# Patient Record
Sex: Female | Born: 1995 | Race: Black or African American | Hispanic: No | Smoking: Current every day smoker
Health system: Southern US, Community
[De-identification: ages and names within clinical notes are randomized; demographics above are authoritative.]

## PROBLEM LIST (undated history)

## (undated) DIAGNOSIS — Z789 Other specified health status: Secondary | ICD-10-CM

## (undated) DIAGNOSIS — F4310 Post-traumatic stress disorder, unspecified: Secondary | ICD-10-CM

## (undated) DIAGNOSIS — R443 Hallucinations, unspecified: Secondary | ICD-10-CM

## (undated) DIAGNOSIS — F329 Major depressive disorder, single episode, unspecified: Secondary | ICD-10-CM

## (undated) DIAGNOSIS — F32A Depression, unspecified: Secondary | ICD-10-CM

## (undated) HISTORY — PX: NO PAST SURGERIES: SHX2092

---

## 2016-07-29 ENCOUNTER — Encounter (HOSPITAL_COMMUNITY): Payer: Self-pay

## 2016-07-29 ENCOUNTER — Inpatient Hospital Stay (HOSPITAL_COMMUNITY)
Admission: AD | Admit: 2016-07-29 | Discharge: 2016-07-29 | Disposition: A | Discharge status: Home / Self Care | Payer: Self-pay | Source: Ambulatory Visit | Attending: Obstetrics and Gynecology | Admitting: Obstetrics and Gynecology

## 2016-07-29 DIAGNOSIS — M545 Low back pain, unspecified: Secondary | ICD-10-CM

## 2016-07-29 DIAGNOSIS — Z809 Family history of malignant neoplasm, unspecified: Secondary | ICD-10-CM | POA: Insufficient documentation

## 2016-07-29 DIAGNOSIS — N760 Acute vaginitis: Secondary | ICD-10-CM

## 2016-07-29 DIAGNOSIS — F17210 Nicotine dependence, cigarettes, uncomplicated: Secondary | ICD-10-CM | POA: Insufficient documentation

## 2016-07-29 DIAGNOSIS — R110 Nausea: Secondary | ICD-10-CM | POA: Insufficient documentation

## 2016-07-29 DIAGNOSIS — B9689 Other specified bacterial agents as the cause of diseases classified elsewhere: Secondary | ICD-10-CM

## 2016-07-29 DIAGNOSIS — N926 Irregular menstruation, unspecified: Secondary | ICD-10-CM

## 2016-07-29 DIAGNOSIS — M549 Dorsalgia, unspecified: Secondary | ICD-10-CM | POA: Insufficient documentation

## 2016-07-29 HISTORY — DX: Other specified health status: Z78.9

## 2016-07-29 LAB — URINALYSIS, ROUTINE W REFLEX MICROSCOPIC
Glucose, UA: NEGATIVE mg/dL
HGB URINE DIPSTICK: NEGATIVE
Ketones, ur: NEGATIVE mg/dL
Leukocytes, UA: NEGATIVE
NITRITE: NEGATIVE
PROTEIN: 100 mg/dL — AB
SPECIFIC GRAVITY, URINE: 1.027 (ref 1.005–1.030)
pH: 5 (ref 5.0–8.0)

## 2016-07-29 LAB — POCT PREGNANCY, URINE: Preg Test, Ur: NEGATIVE

## 2016-07-29 LAB — WET PREP, GENITAL
Sperm: NONE SEEN
Trich, Wet Prep: NONE SEEN
Yeast Wet Prep HPF POC: NONE SEEN

## 2016-07-29 MED ORDER — METRONIDAZOLE 500 MG PO TABS: 500.0000 mg | ORAL_TABLET | Freq: Two times a day (BID) | ORAL | 0 refills | Status: DC

## 2016-07-29 NOTE — MAU Provider Note (Signed)
History   nulparous in with c/o no period in two months. States periods are usually irreg.Marland Kitchen. Desires screen for STD's. Does not desire contraception.  CSN: [id number]  Arrival date & time 07/29/16  1018   None     Chief Complaint  Patient presents with  . Nausea  . Fatigue  . Back Pain    HPI  Past Medical History:  Diagnosis Date  . Medical history non-contributory     Past Surgical History:  Procedure Laterality Date  . NO PAST SURGERIES      Family History  Problem Relation Age of Onset  . Cancer Paternal Grandmother     Social History  Substance Use Topics  . Smoking status: Current Every Day Smoker    Packs/day: 0.25  . Smokeless tobacco: Never Used  . Alcohol use No    OB History    No data available      Review of Systems  Constitutional: Negative.   HENT: Negative.   Eyes: Negative.   Respiratory: Negative.   Cardiovascular: Negative.   Gastrointestinal: Positive for nausea.  Endocrine: Negative.   Genitourinary: Positive for vaginal discharge.  Musculoskeletal: Positive for back pain.  Skin: Negative.   Allergic/Immunologic: Negative.   Neurological: Negative.   Hematological: Negative.   Psychiatric/Behavioral: Negative.     Allergies  Patient has no known allergies.  Home Medications    BP (!) 149/88 (BP Location: Right Arm)   Pulse 81   Temp 98.6 F (37 C)   Resp 16   Ht 6' (1.829 m)   Wt 215 lb (97.5 kg)   LMP 05/29/2016 (Within Weeks)   BMI 29.16 kg/m   Physical Exam  Constitutional: She is oriented to person, place, and time. She appears well-developed and well-nourished.  HENT:  Head: Normocephalic.  Eyes: Pupils are equal, round, and reactive to light.  Neck: Normal range of motion.  Cardiovascular: Normal rate, regular rhythm, normal heart sounds and intact distal pulses.   Pulmonary/Chest: Effort normal and breath sounds normal.  Abdominal: Soft. Bowel sounds are normal.  Genitourinary: Uterus normal. Vaginal  discharge found.  Musculoskeletal: Normal range of motion.  Neurological: She is alert and oriented to person, place, and time. She has normal reflexes.  Skin: Skin is warm and dry.  Psychiatric: She has a normal mood and affect. Her behavior is normal. Judgment and thought content normal.    MAU Course  Procedures (including critical care time)  Labs Reviewed  WET PREP, GENITAL  URINALYSIS, ROUTINE W REFLEX MICROSCOPIC  POCT PREGNANCY, URINE  GC/CHLAMYDIA PROBE AMP ([facility]) NOT [facility]   No results found.   Dx: missed menses BV   MDM  poc UPT neg, VSS, Wet prep pos clue , GC and chla done and to lab.  Will treat for BV and d/c home

## 2016-07-29 NOTE — MAU Note (Signed)
Patient presents with pregnancy symptoms nausea, fatigue, back pains has not had a period in 2 months unsure of date.

## 2016-07-29 NOTE — Discharge Instructions (Signed)
Bacterial Vaginosis °Bacterial vaginosis is an infection of the vagina. It happens when too many germs (bacteria) grow in the vagina. This infection puts you at risk for infections from sex (STIs). Treating this infection can lower your risk for some STIs. You should also treat this if you are pregnant. It can cause your baby to be born early. °Follow these instructions at home: °Medicines °· Take over-the-counter and prescription medicines only as told by your doctor. °· Take or use your antibiotic medicine as told by your doctor. Do not stop taking or using it even if you start to feel better. °General instructions °· If you your sexual partner is a woman, tell her that you have this infection. She needs to get treatment if she has symptoms. If you have a female partner, he does not need to be treated. °· During treatment: °? Avoid sex. °? Do not douche. °? Avoid alcohol as told. °? Avoid breastfeeding as told. °· Drink enough fluid to keep your pee (urine) clear or pale yellow. °· Keep your vagina and butt (rectum) clean. °? Wash the area with warm water every day. °? Wipe from front to back after you use the toilet. °· Keep all follow-up visits as told by your doctor. This is important. °Preventing this condition °· Do not douche. °· Use only warm water to wash around your vagina. °· Use protection when you have sex. This includes: °? Latex condoms. °? Dental dams. °· Limit how many people you have sex with. It is best to only have sex with the same person (be monogamous). °· Get tested for STIs. Have your partner get tested. °· Wear underwear that is cotton or lined with cotton. °· Avoid tight pants and pantyhose. This is most important in summer. °· Do not use any products that have nicotine or tobacco in them. These include cigarettes and e-cigarettes. If you need help quitting, ask your doctor. °· Do not use illegal drugs. °· Limit how much alcohol you drink. °Contact a doctor if: °· Your symptoms do not get  better, even after you are treated. °· You have more discharge or pain when you pee (urinate). °· You have a fever. °· You have pain in your belly (abdomen). °· You have pain with sex. °· Your bleed from your vagina between periods. °Summary °· This infection happens when too many germs (bacteria) grow in the vagina. °· Treating this condition can lower your risk for some infections from sex (STIs). °· You should also treat this if you are pregnant. It can cause early (premature) birth. °· Do not stop taking or using your antibiotic medicine even if you start to feel better. °This information is not intended to replace advice given to you by your health care provider. Make sure you discuss any questions you have with your health care provider. °Document Released: 09/28/2007 Document Revised: 09/04/2015 Document Reviewed: 09/04/2015 °Elsevier Interactive Patient Education © 2017 [employer] °Dysmenorrhea °Dysmenorrhea means painful cramps during your period (menstrual period). You will have pain in your lower belly (abdomen). The pain is caused by the tightening (contracting) of the muscles of the womb (uterus). The pain may be mild or very bad. With this condition, you may: °· Have a headache. °· Feel sick to your stomach (nauseous). °· Throw up (vomit). °· Have lower back pain. ° °Follow these instructions at home: °Helping pain and cramping °· Put heat on your lower back or belly when you have pain or cramps. Use the heat   source that your doctor tells you to use. °? Place a towel between your skin and the heat. °? Leave the heat on for 20-30 minutes. °? Remove the heat if your skin turns bright red. This is especially important if you cannot feel pain, heat, or cold. °? Do not have a heating pad on during sleep. °· Do aerobic exercises. These include walking, swimming, or biking. These may help with cramps. °· Massage your lower back or belly. This may help lessen pain. °General instructions °· Take  over-the-counter and prescription medicines only as told by your doctor. °· Do not drive or use heavy machinery while taking prescription pain medicine. °· Avoid alcohol and caffeine during and right before your period. These can make cramps worse. °· Do not use any products that have nicotine or tobacco. These include cigarettes and e-cigarettes. If you need help quitting, ask your doctor. °· Keep all follow-up visits as told by your doctor. This is important. °Contact a doctor if: °· You have pain that gets worse. °· You have pain that does not get better with medicine. °· You have pain during sex. °· You feel sick to your stomach or you throw up during your period, and medicine does not help. °Get help right away if: °· You pass out (faint). °Summary °· Dysmenorrhea means painful cramps during your period (menstrual period). °· Put heat on your lower back or belly when you have pain or cramps. °· Do exercises like walking, swimming, or biking to help with cramps. °· Contact a doctor if you have pain during sex. °This information is not intended to replace advice given to you by your health care provider. Make sure you discuss any questions you have with your health care provider. °Document Released: 03/17/2008 Document Revised: 01/06/2016 Document Reviewed: 01/06/2016 °Elsevier Interactive Patient Education © 2017 [employer] ° °

## 2016-07-31 LAB — GC/CHLAMYDIA PROBE AMP (~~LOC~~) NOT AT ARMC
CHLAMYDIA, DNA PROBE: NEGATIVE
Neisseria Gonorrhea: NEGATIVE

## 2016-09-14 ENCOUNTER — Emergency Department (HOSPITAL_COMMUNITY): Payer: Self-pay

## 2016-09-14 ENCOUNTER — Encounter (HOSPITAL_COMMUNITY): Payer: Self-pay | Admitting: Emergency Medicine

## 2016-09-14 ENCOUNTER — Emergency Department (HOSPITAL_COMMUNITY)
Admission: EM | Admit: 2016-09-14 | Discharge: 2016-09-15 | Disposition: A | Discharge status: Home / Self Care | Payer: Federal, State, Local not specified - Other | Attending: Emergency Medicine | Admitting: Emergency Medicine

## 2016-09-14 DIAGNOSIS — R079 Chest pain, unspecified: Secondary | ICD-10-CM | POA: Insufficient documentation

## 2016-09-14 DIAGNOSIS — F333 Major depressive disorder, recurrent, severe with psychotic symptoms: Secondary | ICD-10-CM | POA: Diagnosis present

## 2016-09-14 DIAGNOSIS — F329 Major depressive disorder, single episode, unspecified: Secondary | ICD-10-CM | POA: Insufficient documentation

## 2016-09-14 DIAGNOSIS — F17210 Nicotine dependence, cigarettes, uncomplicated: Secondary | ICD-10-CM | POA: Insufficient documentation

## 2016-09-14 DIAGNOSIS — R45851 Suicidal ideations: Secondary | ICD-10-CM | POA: Insufficient documentation

## 2016-09-14 DIAGNOSIS — R0602 Shortness of breath: Secondary | ICD-10-CM | POA: Insufficient documentation

## 2016-09-14 HISTORY — DX: Hallucinations, unspecified: R44.3

## 2016-09-14 HISTORY — DX: Major depressive disorder, single episode, unspecified: F32.9

## 2016-09-14 HISTORY — DX: Depression, unspecified: F32.A

## 2016-09-14 HISTORY — DX: Post-traumatic stress disorder, unspecified: F43.10

## 2016-09-14 LAB — ACETAMINOPHEN LEVEL: Acetaminophen (Tylenol), Serum: <10 ug/mL — ABNORMAL LOW (ref 10–30)

## 2016-09-14 LAB — CBC
HEMATOCRIT: 32.6 % — AB (ref 36.0–46.0)
Hemoglobin: 10.6 g/dL — ABNORMAL LOW (ref 12.0–15.0)
MCH: 24.6 pg — ABNORMAL LOW (ref 26.0–34.0)
MCHC: 32.5 g/dL (ref 30.0–36.0)
MCV: 75.6 fL — AB (ref 78.0–100.0)
Platelets: 377 10*3/uL (ref 150–400)
RBC: 4.31 MIL/uL (ref 3.87–5.11)
RDW: 15.3 % (ref 11.5–15.5)
WBC: 8.2 10*3/uL (ref 4.0–10.5)

## 2016-09-14 LAB — COMPREHENSIVE METABOLIC PANEL
ALBUMIN: 3.6 g/dL (ref 3.5–5.0)
ALT: 13 U/L — ABNORMAL LOW (ref 14–54)
AST: 21 U/L (ref 15–41)
Alkaline Phosphatase: 70 U/L (ref 38–126)
Anion gap: 9 (ref 5–15)
BUN: 8 mg/dL (ref 6–20)
CHLORIDE: 103 mmol/L (ref 101–111)
CO2: 24 mmol/L (ref 22–32)
Calcium: 9.5 mg/dL (ref 8.9–10.3)
Creatinine, Ser: 0.67 mg/dL (ref 0.44–1.00)
GFR calc Af Amer: >60 mL/min (ref >60)
GFR calc non Af Amer: >60 mL/min (ref >60)
GLUCOSE: 96 mg/dL (ref 65–99)
POTASSIUM: 3.7 mmol/L (ref 3.5–5.1)
Sodium: 136 mmol/L (ref 135–145)
Total Bilirubin: 0.3 mg/dL (ref 0.3–1.2)
Total Protein: 8.6 g/dL — ABNORMAL HIGH (ref 6.5–8.1)

## 2016-09-14 LAB — RAPID URINE DRUG SCREEN, HOSP PERFORMED
AMPHETAMINES: NOT DETECTED
BARBITURATES: NOT DETECTED
BENZODIAZEPINES: NOT DETECTED
Cocaine: NOT DETECTED
Opiates: NOT DETECTED
TETRAHYDROCANNABINOL: NOT DETECTED

## 2016-09-14 LAB — PREGNANCY, URINE: Preg Test, Ur: NEGATIVE

## 2016-09-14 LAB — ETHANOL: Alcohol, Ethyl (B): <5 mg/dL (ref <5)

## 2016-09-14 LAB — SALICYLATE LEVEL: Salicylate Lvl: <7 mg/dL (ref 2.8–30.0)

## 2016-09-14 MED ORDER — ALUM & MAG HYDROXIDE-SIMETH 200-200-20 MG/5ML PO SUSP: 30.0000 mL | Freq: Four times a day (QID) | ORAL | Status: DC | PRN

## 2016-09-14 MED ORDER — ACETAMINOPHEN 325 MG PO TABS: 650.0000 mg | ORAL_TABLET | ORAL | Status: DC | PRN

## 2016-09-14 MED ORDER — ZOLPIDEM TARTRATE 5 MG PO TABS: 5.0000 mg | ORAL_TABLET | Freq: Every evening | ORAL | Status: DC | PRN

## 2016-09-14 MED ORDER — ONDANSETRON HCL 4 MG PO TABS: 4.0000 mg | ORAL_TABLET | Freq: Three times a day (TID) | ORAL | Status: DC | PRN
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
## 2016-09-14 NOTE — ED Triage Notes (Signed)
Pt comes from motel via EMS stating she is feeling suicidal.  Ambulatory.  A&O x4. BP 130/92, HR 115 in route. Suffers from several mental disorders per EMS.  Patient calm on arrival. Non compliant with medications.

## 2016-09-14 NOTE — ED Notes (Signed)
[facility] Expected date:  Expected time:  Means of arrival:  Comments: EMS 21 y/o SI

## 2016-09-14 NOTE — ED Notes (Signed)
Bed: [facility] Expected date:  Expected time:  Means of arrival:  Comments: [facility] 

## 2016-09-14 NOTE — BH Assessment (Signed)
[facility]   09/14/2016 ; Recommendations, per Nanine MeansJamison Lord, DNP, patient will have AM psy. Evaluation

## 2016-09-15 ENCOUNTER — Inpatient Hospital Stay (HOSPITAL_COMMUNITY)
Admission: AD | Admit: 2016-09-15 | Discharge: 2016-09-19 | DRG: 885 | Disposition: A | Discharge status: Home / Self Care | Payer: Federal, State, Local not specified - Other | Source: Intra-hospital | Attending: Emergency Medicine | Admitting: Emergency Medicine

## 2016-09-15 ENCOUNTER — Encounter (HOSPITAL_COMMUNITY): Payer: Self-pay | Admitting: General Practice

## 2016-09-15 DIAGNOSIS — L03311 Cellulitis of abdominal wall: Secondary | ICD-10-CM | POA: Diagnosis present

## 2016-09-15 DIAGNOSIS — R45851 Suicidal ideations: Secondary | ICD-10-CM | POA: Diagnosis present

## 2016-09-15 DIAGNOSIS — Z809 Family history of malignant neoplasm, unspecified: Secondary | ICD-10-CM

## 2016-09-15 DIAGNOSIS — L02211 Cutaneous abscess of abdominal wall: Secondary | ICD-10-CM

## 2016-09-15 DIAGNOSIS — F17210 Nicotine dependence, cigarettes, uncomplicated: Secondary | ICD-10-CM

## 2016-09-15 DIAGNOSIS — F333 Major depressive disorder, recurrent, severe with psychotic symptoms: Principal | ICD-10-CM | POA: Diagnosis present

## 2016-09-15 DIAGNOSIS — F603 Borderline personality disorder: Secondary | ICD-10-CM

## 2016-09-15 DIAGNOSIS — F4310 Post-traumatic stress disorder, unspecified: Secondary | ICD-10-CM | POA: Diagnosis present

## 2016-09-15 DIAGNOSIS — F332 Major depressive disorder, recurrent severe without psychotic features: Secondary | ICD-10-CM | POA: Insufficient documentation

## 2016-09-15 DIAGNOSIS — R440 Auditory hallucinations: Secondary | ICD-10-CM | POA: Diagnosis not present

## 2016-09-15 DIAGNOSIS — F419 Anxiety disorder, unspecified: Secondary | ICD-10-CM

## 2016-09-15 DIAGNOSIS — T1491XA Suicide attempt, initial encounter: Secondary | ICD-10-CM

## 2016-09-15 DIAGNOSIS — X789XXA Intentional self-harm by unspecified sharp object, initial encounter: Secondary | ICD-10-CM

## 2016-09-15 DIAGNOSIS — Z590 Homelessness: Secondary | ICD-10-CM

## 2016-09-15 DIAGNOSIS — R441 Visual hallucinations: Secondary | ICD-10-CM | POA: Diagnosis not present

## 2016-09-15 DIAGNOSIS — Z79899 Other long term (current) drug therapy: Secondary | ICD-10-CM | POA: Diagnosis not present

## 2016-09-15 MED ORDER — FLUOXETINE HCL 10 MG PO CAPS
10.0000 mg | ORAL_CAPSULE | Freq: Every day | ORAL | Status: DC
  Administered 2016-09-16: 10 mg via ORAL
  Filled 2016-09-15 (×4): qty 1

## 2016-09-15 MED ORDER — OLANZAPINE 5 MG PO TABS
5.0000 mg | ORAL_TABLET | Freq: Every day | ORAL | Status: DC
  Administered 2016-09-17 – 2016-09-18 (×2): 5 mg via ORAL
  Filled 2016-09-15: qty 1
  Filled 2016-09-15: qty 7
  Filled 2016-09-15 (×5): qty 1
  Filled 2016-09-15: qty 7

## 2016-09-15 MED ORDER — ACETAMINOPHEN 325 MG PO TABS
650.0000 mg | ORAL_TABLET | ORAL | Status: DC | PRN
  Administered 2016-09-17 – 2016-09-18 (×6): 650 mg via ORAL
  Filled 2016-09-15 (×5): qty 2

## 2016-09-15 MED ORDER — OLANZAPINE 5 MG PO TABS: 5.0000 mg | ORAL_TABLET | Freq: Every day | ORAL | Status: DC

## 2016-09-15 MED ORDER — MAGNESIUM HYDROXIDE 400 MG/5ML PO SUSP: 30.0000 mL | Freq: Every day | ORAL | Status: DC | PRN

## 2016-09-15 MED ORDER — FLUOXETINE HCL 10 MG PO CAPS
10.0000 mg | ORAL_CAPSULE | Freq: Every day | ORAL | Status: DC
  Administered 2016-09-15: 10 mg via ORAL
  Filled 2016-09-15: qty 1

## 2016-09-15 NOTE — Progress Notes (Signed)
Patient ID: Melody Robinson, female   DOB: [birth year], 21 y.o.   MRN: [id number]   [name], who prefers to be called [name], is a 21 year old female admitted to [facility] for SI and hallucinations. Patient denies intent or plan for suicide at this time, "just thoughts." She is abel to contract for safety. Prior to admission to [facility] patient endorsed auditory and visual hallucinations. However, upon admission to [facility] patient reports just "hearing voices" that are mumbling at this time. Patient denies that they are command in nature. Patient denies homicidal ideation. She denies pain at this time. Patient's skin search reveals self-inflicted cuts on patient's bilateral forearms which appear to be fresh. Patient reports she cut right before admission into ED. Patient's thighs also have self inflicted cut scars, these appear older than those on patient's forearms. Patient also has an abcess on her mid-abdomen. This is not open at this time. Patient reports that this is not the first time that this has occurred. She reports that she moved from [place] a few months ago to get away from her family. She reports, "I'm here to see if I have Schizophrenia and check my PTSD." Patient does appear disheveled, her hair is matted at this time. She is blunted in affect and eye contact is brief. She reports that she is going through a divorce but does not elaborate on this subject. She reports that she has been hospitalized for mental health issues, "too many times to count." She reports since the age of 45. She is given hygiene supplies and oriented to the unit. Q15 minute safety checks are initiated. Patient is given a meal for dinner however reports that she is not hungry and throws majority of her tray away.

## 2016-09-15 NOTE — Tx Team (Signed)
Initial Treatment Plan 09/15/2016 6:34 PM Rondel Oh ZOX:[id number]    PATIENT STRESSORS: Financial difficulties Medication change or noncompliance Traumatic event   PATIENT STRENGTHS: Communication skills General fund of knowledge   PATIENT IDENTIFIED PROBLEMS: "Reduce thoughts of suicide"    "Try to get back on some medications"     Depression  Suicidal Ideation   PTSD   Psychosis        DISCHARGE CRITERIA:  Improved stabilization in mood, thinking, and/or behavior Verbal commitment to aftercare and medication compliance  PRELIMINARY DISCHARGE PLAN: Outpatient therapy Placement in alternative living arrangements  PATIENT/FAMILY INVOLVEMENT: This treatment plan has been presented to and reviewed with the patient, Melody Robinson.  The patient and family have been given the opportunity to ask questions and make suggestions.  Larina Earthly, RN 09/15/2016, 6:34 PM

## 2016-09-15 NOTE — Progress Notes (Signed)
09/15/16 1347:  LRT introduced self to pt and offered activities.  Pt was lying down watching television.  Pt wanted to know what activities were available.  LRT informed pt of activities.  Pt requested coloring pages of animals.  LRT printed off pictures of animals for pt and gave them to her.   Caroll Rancher, LRT/CTRS

## 2016-09-15 NOTE — Consult Note (Signed)
[facility] Consult   Reason for Consult:  Depression with suicide attempt Referring Physician:  EDP Patient Identification: Melody Robinson MRN:  [id number] Principal Diagnosis: Major depressive disorder, recurrent episode, severe, with psychosis ([place]) Diagnosis:   Patient Active Problem List   Diagnosis Date Noted  . Major depressive disorder, recurrent episode, severe, with psychosis ([place]) [F33.3] 09/15/2016    Priority: High    Total Time spent with patient: 45 minutes  Subjective:   Melody Robinson is a 21 y.o. female patient admitted with suicide attempt.  HPI:  21 yo female who presented to the ED after cutting and feeling like she was going to kill herself, called 911.  Today, she continues to endorse suicidal ideations and depression. She moved to [place] tow months ago from [place], currently homeless.  Reports she has ADHD, Aspergers, bipolar, anxiety, depression, PTSD, etc. But has not been on medications for "years."  Appears to have a personality disorder the dominates on presentation.  No homicidal ideations or substance abuse.  Past Psychiatric History: bipolar, depression, ADHD, etc.  Risk to Self: Suicidal Ideation: Yes-Currently Present Suicidal Intent: Yes-Currently Present (self-mutation / overdose) Is patient at risk for suicide?: Yes (plans to cut herself or overdose) Suicidal Plan?: Yes-Currently Present (self mutation / overdose) Specify Current Suicidal Plan: cutting/overdose Access to Means: No What has been your use of drugs/alcohol within the last 12 months?: THC How many times?: 5 Other Self Harm Risks: cutting Triggers for Past Attempts: Hallucinations Intentional Self Injurious Behavior: Cutting Comment - Self Injurious Behavior: Reports started cutting self age 60 Risk to Others: Homicidal Ideation: No Thoughts of Harm to Others: No Current Homicidal Intent: No Current Homicidal Plan: No Access to Homicidal Means: No Identified  Victim: none reported History of harm to others?: No Assessment of Violence: None Noted Violent Behavior Description: none reported Does patient have access to weapons?: No Criminal Charges Pending?: No Does patient have a court date: No Prior Inpatient Therapy: Prior Inpatient Therapy: Yes Prior Therapy Dates: unknown Prior Therapy Facilty/Provider(s): unknown Reason for Treatment: depression Prior Outpatient Therapy: Prior Outpatient Therapy: No Prior Therapy Dates: none reported Prior Therapy Facilty/Provider(s): none reported Reason for Treatment: none reported Does patient have an ACCT team?: No Does patient have Intensive In-House Services?  : No Does patient have [facility] services? : No Does patient have P4CC services?: No  Past Medical History:  Past Medical History:  Diagnosis Date  . Depression   . Hallucinations   . Medical history non-contributory   . PTSD (post-traumatic stress disorder)     Past Surgical History:  Procedure Laterality Date  . NO PAST SURGERIES     Family History:  Family History  Problem Relation Age of Onset  . Cancer Paternal Grandmother    Family Psychiatric  History: unknown Social History:  History  Alcohol Use No     History  Drug Use No    Social History   Social History  . Marital status: Legally Separated    Spouse name: N/A  . Number of children: N/A  . Years of education: N/A   Social History Main Topics  . Smoking status: Current Every Day Smoker    Packs/day: 0.25  . Smokeless tobacco: Never Used  . Alcohol use No  . Drug use: No  . Sexual activity: Yes    Birth control/ protection: None   Other Topics Concern  . None   Social History Narrative  . None   Additional Social History:  Allergies:  No Known Allergies  Labs:  Results for orders placed or performed during the hospital encounter of 09/14/16 (from the past 48 hour(s))  Comprehensive metabolic panel     Status: Abnormal   Collection Time:  09/14/16  5:26 PM  Result Value Ref Range   Sodium 136 135 - 145 mmol/L   Potassium 3.7 3.5 - 5.1 mmol/L   Chloride 103 101 - 111 mmol/L   CO2 24 22 - 32 mmol/L   Glucose, Bld 96 65 - 99 mg/dL   BUN 8 6 - 20 mg/dL   Creatinine, Ser 0.67 0.44 - 1.00 mg/dL   Calcium 9.5 8.9 - 10.3 mg/dL   Total Protein 8.6 (H) 6.5 - 8.1 g/dL   Albumin 3.6 3.5 - 5.0 g/dL   AST 21 15 - 41 U/L   ALT 13 (L) 14 - 54 U/L   Alkaline Phosphatase 70 38 - 126 U/L   Total Bilirubin 0.3 0.3 - 1.2 mg/dL   GFR calc non Af Amer >60 >60 mL/min   GFR calc Af Amer >60 >60 mL/min    Comment: (NOTE) The eGFR has been calculated using the CKD EPI equation. This calculation has not been validated in all clinical situations. eGFR's persistently <60 mL/min signify possible Chronic Kidney Disease.    Anion gap 9 5 - 15  Ethanol     Status: None   Collection Time: 09/14/16  5:26 PM  Result Value Ref Range   Alcohol, Ethyl (B) <5 <5 mg/dL    Comment:        LOWEST DETECTABLE LIMIT FOR SERUM ALCOHOL IS 5 mg/dL FOR MEDICAL PURPOSES ONLY   Salicylate level     Status: None   Collection Time: 09/14/16  5:26 PM  Result Value Ref Range   Salicylate Lvl <6.5 2.8 - 30.0 mg/dL  Acetaminophen level     Status: Abnormal   Collection Time: 09/14/16  5:26 PM  Result Value Ref Range   Acetaminophen (Tylenol), Serum <10 (L) 10 - 30 ug/mL    Comment:        THERAPEUTIC CONCENTRATIONS VARY SIGNIFICANTLY. A RANGE OF 10-30 ug/mL MAY BE AN EFFECTIVE CONCENTRATION FOR MANY PATIENTS. HOWEVER, SOME ARE BEST TREATED AT CONCENTRATIONS OUTSIDE THIS RANGE. ACETAMINOPHEN CONCENTRATIONS >150 ug/mL AT 4 HOURS AFTER INGESTION AND >50 ug/mL AT 12 HOURS AFTER INGESTION ARE OFTEN ASSOCIATED WITH TOXIC REACTIONS.   cbc     Status: Abnormal   Collection Time: 09/14/16  5:26 PM  Result Value Ref Range   WBC 8.2 4.0 - 10.5 K/uL   RBC 4.31 3.87 - 5.11 MIL/uL   Hemoglobin 10.6 (L) 12.0 - 15.0 g/dL   HCT 32.6 (L) 36.0 - 46.0 %   MCV 75.6  (L) 78.0 - 100.0 fL   MCH 24.6 (L) 26.0 - 34.0 pg   MCHC 32.5 30.0 - 36.0 g/dL   RDW 15.3 11.5 - 15.5 %   Platelets 377 150 - 400 K/uL  Pregnancy, urine     Status: None   Collection Time: 09/14/16  7:04 PM  Result Value Ref Range   Preg Test, Ur NEGATIVE NEGATIVE    Comment:        THE SENSITIVITY OF THIS METHODOLOGY IS >20 mIU/mL.   Rapid urine drug screen (hospital performed)     Status: None   Collection Time: 09/14/16  7:04 PM  Result Value Ref Range   Opiates NONE DETECTED NONE DETECTED   Cocaine NONE DETECTED NONE DETECTED   Benzodiazepines  NONE DETECTED NONE DETECTED   Amphetamines NONE DETECTED NONE DETECTED   Tetrahydrocannabinol NONE DETECTED NONE DETECTED   Barbiturates NONE DETECTED NONE DETECTED    Comment:        DRUG SCREEN FOR MEDICAL PURPOSES ONLY.  IF CONFIRMATION IS NEEDED FOR ANY PURPOSE, NOTIFY LAB WITHIN 5 DAYS.        LOWEST DETECTABLE LIMITS FOR URINE DRUG SCREEN Drug Class       Cutoff (ng/mL) Amphetamine      1000 Barbiturate      200 Benzodiazepine   503 Tricyclics       546 Opiates          300 Cocaine          300 THC              50     Current Facility-Administered Medications  Medication Dose Route Frequency Provider Last Rate Last Dose  . acetaminophen (TYLENOL) tablet 650 mg  650 mg Oral Q4H PRN Sherwood Gambler, MD      . alum & mag hydroxide-simeth (MAALOX/MYLANTA) 200-200-20 MG/5ML suspension 30 mL  30 mL Oral Q6H PRN Sherwood Gambler, MD      . FLUoxetine (PROZAC) capsule 10 mg  10 mg Oral Daily Kjuan Seipp, MD   10 mg at 09/15/16 1237  . OLANZapine (ZYPREXA) tablet 5 mg  5 mg Oral QHS Cedar Ditullio, MD      . ondansetron (ZOFRAN) tablet 4 mg  4 mg Oral Q8H PRN Sherwood Gambler, MD       Current Outpatient Prescriptions  Medication Sig Dispense Refill  . metroNIDAZOLE (FLAGYL) 500 MG tablet Take 1 tablet (500 mg total) by mouth 2 (two) times daily. (Patient not taking: Reported on 09/14/2016) 14 tablet 0     Musculoskeletal: Strength & Muscle Tone: within normal limits Gait & Station: normal Patient leans: N/A  Psychiatric Specialty Exam: Physical Exam  Constitutional: She is oriented to person, place, and time. She appears well-developed and well-nourished.  HENT:  Head: Normocephalic.  Neck: Normal range of motion.  Respiratory: Effort normal.  Musculoskeletal: Normal range of motion.  Neurological: She is alert and oriented to person, place, and time.  Psychiatric: Her speech is normal and behavior is normal. Judgment normal. Her mood appears anxious. Cognition and memory are normal. She expresses suicidal ideation. She expresses suicidal plans.    Review of Systems  Psychiatric/Behavioral: Positive for depression and suicidal ideas.  All other systems reviewed and are negative.   Blood pressure 124/84, pulse 88, temperature 99.4 F (37.4 C), temperature source Oral, resp. rate 17, weight 97.5 kg (215 lb), SpO2 100 %.Body mass index is 29.16 kg/m.  General Appearance: Casual  Eye Contact:  Fair  Speech:  Normal Rate  Volume:  Decreased  Mood:  Depressed  Affect:  Congruent  Thought Process:  Coherent and Descriptions of Associations: Intact  Orientation:  Full (Time, Place, and Person)  Thought Content:  Rumination  Suicidal Thoughts:  Yes.  with intent/plan  Homicidal Thoughts:  No  Memory:  Immediate;   Fair Recent;   Fair Remote;   Fair  Judgement:  Fair  Insight:  Fair  Psychomotor Activity:  Decreased  Concentration:  Concentration: Fair and Attention Span: Fair  Recall:  [employer] of Knowledge:  Fair  Language:  Good  Akathisia:  No  Handed:  Right  AIMS (if indicated):     Assets:  Leisure Time Physical Health Resilience  ADL's:  Intact  Cognition:  WNL  Sleep:        Treatment Plan Summary: Daily contact with patient to assess and evaluate symptoms and progress in treatment, Medication management and Plan major depressive disorder, recurrent,  severe without psychosis:  -Crisis stabilization -Medication management:  Started Prozac 10 mg daily for depression and Zyprexa 5 mg at bedtime for psychosis -Individual counseling  Disposition: Recommend psychiatric Inpatient admission when medically cleared.  Waylan Boga, NP 09/15/2016 12:45 PM  Patient seen face-to-face for psychiatric evaluation, chart reviewed and case discussed with the physician extender and developed treatment plan. Reviewed the information documented and agree with the treatment plan. Corena Pilgrim, MD

## 2016-09-15 NOTE — Progress Notes (Signed)
D    Pt has been visible on the milieu   She interacts well with peers and is cooperative   She attended group and was attentive   Pt has poor hyegene and is somewhat limited cognitively  A    Verbal support given  Medications administered and effectiveness monitored   Q 15 min checks R   Pt is safe at present time

## 2016-09-15 NOTE — Progress Notes (Signed)
Per Berneice Heinrich , [facility], patient has been accepted to [facility], bed 406-1 ; Accepting provider is Dr. Jannifer Franklin; Attending provider is Dr. Jama Flavors.  Number for report is [phone].  Per Berneice Heinrich, [facility], patient's nurse is aware.   Baldo Daub MSW, LCSWA CSW Disposition [phone]

## 2016-09-16 DIAGNOSIS — Z62811 Personal history of psychological abuse in childhood: Secondary | ICD-10-CM

## 2016-09-16 DIAGNOSIS — F419 Anxiety disorder, unspecified: Secondary | ICD-10-CM

## 2016-09-16 DIAGNOSIS — F39 Unspecified mood [affective] disorder: Secondary | ICD-10-CM

## 2016-09-16 DIAGNOSIS — R441 Visual hallucinations: Secondary | ICD-10-CM

## 2016-09-16 DIAGNOSIS — F17210 Nicotine dependence, cigarettes, uncomplicated: Secondary | ICD-10-CM

## 2016-09-16 DIAGNOSIS — Z599 Problem related to housing and economic circumstances, unspecified: Secondary | ICD-10-CM

## 2016-09-16 DIAGNOSIS — G4700 Insomnia, unspecified: Secondary | ICD-10-CM

## 2016-09-16 DIAGNOSIS — R440 Auditory hallucinations: Secondary | ICD-10-CM

## 2016-09-16 DIAGNOSIS — Z62810 Personal history of physical and sexual abuse in childhood: Secondary | ICD-10-CM

## 2016-09-16 DIAGNOSIS — F603 Borderline personality disorder: Secondary | ICD-10-CM

## 2016-09-16 DIAGNOSIS — Z560 Unemployment, unspecified: Secondary | ICD-10-CM

## 2016-09-16 LAB — HCG, QUANTITATIVE, PREGNANCY

## 2016-09-16 MED ORDER — TRAZODONE HCL 50 MG PO TABS
50.0000 mg | ORAL_TABLET | Freq: Every evening | ORAL | Status: DC | PRN
  Administered 2016-09-17 – 2016-09-18 (×2): 50 mg via ORAL
  Filled 2016-09-16 (×2): qty 1
  Filled 2016-09-16: qty 7

## 2016-09-16 NOTE — [facility] Group Notes (Signed)
[facility] Group Notes:  (Nursing/MHT/Case Management/Adjunct)  Date:  09/16/2016  Time:  4:06 PM  Type of Therapy:  Psychoeducational Skills  Participation Level:  Active  Participation Quality:  Appropriate  Affect:  Appropriate  Cognitive:  Appropriate  Insight:  Appropriate  Engagement in Group:  Engaged  Modes of Intervention:  Discussion  Summary of Progress/Problems: Pt did attend self inventory group.   Jacquelyne Balint Shanta 09/16/2016, 4:06 PM

## 2016-09-16 NOTE — [facility] Group Notes (Signed)
[facility] LCSW Group Therapy Note  Date/Time 09/16/16 10:00AM  Type of Therapy and Topic:  Group Therapy:  Cognitive Distortions  Participation Level:  Active   Description of Group:    Patients in this group will be introduced to the topic of cognitive distortions.  Patients will identify and describe cognitive distortions, describe the feelings these distortions create for them.  Patients will identify one or more situations in their personal life where they have cognitively distorted thinking and will verbalize challenging this cognitive distortion through positive thinking skills.  Patients will practice the skill of using positive affirmations to challenge cognitive distortions.    Therapeutic Goals:  1. Patient will identify two or more cognitive distortions they have used 2. Patient will identify one or more emotions that stem from use of a cognitive distortion 3. Patient will demonstrate use of a positive affirmation to counter a cognitive distortion through discussion and/or role play. 4. Patient will describe one way cognitive distortions can be detrimental to wellness   Summary of Patient Progress: Patient identified that perceptions and misperceptions about her size have often been a challenge for her to work through. Patient also able to identify how her emotions have an impact on those perceptions.   Therapeutic Modalities:   Cognitive Behavioral Therapy Motivational Interviewing   Beverly Sessions MSW, LCSW
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
## 2016-09-16 NOTE — Progress Notes (Signed)
DAR NOTE: Patient presents with anxious affect and mood.  Denies pain, auditory and visual hallucinations.  Described energy level as normal and concentration as good.  Rates depression at 5, hopelessness at 0, and anxiety at 8.  Maintained on routine safety checks.  Medications given as prescribed.  Support and encouragement offered as needed.  Attended group and participated.  States goal for today is "try to stay positive."  Patient observed socializing with peers in the dayroom.  Offered no complaint.
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
## 2016-09-16 NOTE — Progress Notes (Addendum)
D    Pt has been visible on the milieu   She interacts well with peers and is cooperative   She attended group and was attentive   Pt has poor hyegene and is somewhat limited cognitively   She refused medications this evening A    Verbal support given  Medications offered   Q 15 min checks R   Pt is safe at present time

## 2016-09-16 NOTE — Plan of Care (Signed)
Problem: Safety: Goal: Periods of time without injury will increase Outcome: Progressing Patient is safe and free from injury.  Denies suicidal thoughts and ideation.   

## 2016-09-16 NOTE — H&P (Signed)
Psychiatric Admission Assessment Adult  Patient Identification: Melody Robinson MRN:  [id number] Date of Evaluation:  09/16/2016 Chief Complaint:  MDD,rec with psychotic features Principal Diagnosis: Major depressive disorder, recurrent episode, severe, with psychosis ([place]) Diagnosis:   Patient Active Problem List   Diagnosis Date Noted  . Major depressive disorder, recurrent episode, severe, with psychosis ([place]) [F33.3] 09/15/2016  . Major depressive disorder, recurrent severe without psychotic features ([place]) [F33.2] 09/15/2016   History of Present Illness:  ED Note: 21 year old female presents with suicidal thoughts and hearing voices as well as visual hallucinations. She states that this has been an on and off issue since she was about 21 years old and has been admitted multiple times. Formally this was in [place]. She states that she's been in [place] for the last several months. 2 weeks ago she talked to her mom, which usually ends up in a fight and usually setting off some of her psychiatric disease. She has now been feeling depressed, suicidal, and has had the voices and visual hallucinations. She states the voices are dark voices and she is seeing demons. She states that she has been told she has schizophrenia but has not been on any meds for about 2 years. She does not know what she used to be on. She states that she smokes but has not smoked in a few days. Drinks alcohol sparingly and does not use drugs. She states she has had some intermittent chest pain that feels like someone is squeezing her heart as well as concomitant shortness of breath. She usually states this comes with anxiety and is consistent with her prior anxiety. She will start to hyperventilate. Currently does not feel the symptoms now. No recent travel in the last 1 month or leg swelling or leg pain. States she has had multiple different thoughts of how to kill herself including taking her friend's gun and shooting herself in  the head. Recently she has cut herself on her right forearm with a knife.  Assessment Note: Melody Vandemark., who prefers to be called Melody Robinson, is a 21 year old female admitted to [facility] for SI and hallucinations. Patient denies intent or plan for suicide at this time, "just thoughts." She is abel to contract for safety. Prior to admission to [facility] patient endorsed auditory and visual hallucinations. However, upon admission to [facility] patient reports just "hearing voices" that are mumbling at this time. Patient denies that they are command in nature. Patient denies homicidal ideation. She denies pain at this time. Patient's skin search reveals self-inflicted cuts on patient's bilateral forearms which appear to be fresh. Patient reports she cut right before admission into ED. Patient's thighs also have self inflicted cut scars, these appear older than those on patient's forearms. Patient also has an abcess on her mid-abdomen. This is not open at this time. Patient reports that this is not the first time that this has occurred. She reports that she moved from [place] a few months ago to get away from her family. She reports, "I'm here to see if I have Schizophrenia and check my PTSD." Patient does appear disheveled, her hair is matted at this time. She is blunted in affect and eye contact is brief. She reports that she is going through a divorce but does not elaborate on this subject. She reports that she has been hospitalized for mental health issues, "too many times to count."  Patient reports to me that she thinks she has been on Prozac and Abilify in the past, but  she cannot take Abilify anymore. She states that she has been on so many medications that she doesn't remember all of them. Her last reported hospitalization was in [place], [place] 5 years ago. While discussing medications the patient asks what effects the medications would have on her baby. She reports that she is pregnant about 2 months. The lab results indicate urine  preg test is negative, patient then states that happened before and the provider told her that the urine may show up negative. Discussed with Dr. Sanjuana Letters and the decision was made to ensure by blood test whether patient is pregnant or if it a delusional thought. She does admit AH at this time as well.   Associated Signs/Symptoms: Depression Symptoms:  depressed mood, fatigue, hopelessness, suicidal thoughts with specific plan, anxiety, (Hypo) Manic Symptoms:  Delusions, Hallucinations, Anxiety Symptoms:  Denies Psychotic Symptoms:  Delusions, Hallucinations: Auditory PTSD Symptoms: Patient reports PTSD but symptoms do not match PTSD Total Time spent with patient: 30 minutes  Past Psychiatric History: Unsure, patient is poor historian or is not willing to share appropriate information  Is the patient at risk to self? Yes.    Has the patient been a risk to self in the past 6 months? No.  Has the patient been a risk to self within the distant past? No.  Is the patient a risk to others? No.  Has the patient been a risk to others in the past 6 months? No.  Has the patient been a risk to others within the distant past? No.   Prior Inpatient Therapy:   Prior Outpatient Therapy:    Alcohol Screening: 1. How often do you have a drink containing alcohol?: Never 9. Have you or someone else been injured as a result of your drinking?: No 10. Has a relative or friend or a doctor or another health worker been concerned about your drinking or suggested you cut down?: No Alcohol Use Disorder Identification Test Final Score (AUDIT): 0 Brief Intervention: AUDIT score less than 7 or less-screening does not suggest unhealthy drinking-brief intervention not indicated Substance Abuse History in the last 12 months:  No. Consequences of Substance Abuse: NA Previous Psychotropic Medications: Yes - only ones remembered Abilify and Prozac Psychological Evaluations: Yes somewhere in [place] Past Medical  History:  Past Medical History:  Diagnosis Date  . Depression   . Hallucinations   . Medical history non-contributory   . PTSD (post-traumatic stress disorder)     Past Surgical History:  Procedure Laterality Date  . NO PAST SURGERIES     Family History:  Family History  Problem Relation Age of Onset  . Cancer Paternal Grandmother    Family Psychiatric  History: Denies Tobacco Screening: Have you used any form of tobacco in the last 30 days? (Cigarettes, Smokeless Tobacco, Cigars, and/or Pipes): Yes Tobacco use, Select all that apply: 4 or less cigarettes per day Are you interested in Tobacco Cessation Medications?: No, patient refused Social History:  History  Alcohol Use No     History  Drug Use No    Additional Social History:      Allergies:  No Known Allergies Lab Results:  Results for orders placed or performed during the hospital encounter of 09/14/16 (from the past 48 hour(s))  Comprehensive metabolic panel     Status: Abnormal   Collection Time: 09/14/16  5:26 PM  Result Value Ref Range   Sodium 136 135 - 145 mmol/L   Potassium 3.7 3.5 - 5.1 mmol/L   Chloride  103 101 - 111 mmol/L   CO2 24 22 - 32 mmol/L   Glucose, Bld 96 65 - 99 mg/dL   BUN 8 6 - 20 mg/dL   Creatinine, Ser 0.67 0.44 - 1.00 mg/dL   Calcium 9.5 8.9 - 10.3 mg/dL   Total Protein 8.6 (H) 6.5 - 8.1 g/dL   Albumin 3.6 3.5 - 5.0 g/dL   AST 21 15 - 41 U/L   ALT 13 (L) 14 - 54 U/L   Alkaline Phosphatase 70 38 - 126 U/L   Total Bilirubin 0.3 0.3 - 1.2 mg/dL   GFR calc non Af Amer >60 >60 mL/min   GFR calc Af Amer >60 >60 mL/min    Comment: (NOTE) The eGFR has been calculated using the CKD EPI equation. This calculation has not been validated in all clinical situations. eGFR's persistently <60 mL/min signify possible Chronic Kidney Disease.    Anion gap 9 5 - 15  Ethanol     Status: None   Collection Time: 09/14/16  5:26 PM  Result Value Ref Range   Alcohol, Ethyl (B) <5 <5 mg/dL     Comment:        LOWEST DETECTABLE LIMIT FOR SERUM ALCOHOL IS 5 mg/dL FOR MEDICAL PURPOSES ONLY   Salicylate level     Status: None   Collection Time: 09/14/16  5:26 PM  Result Value Ref Range   Salicylate Lvl <4.4 2.8 - 30.0 mg/dL  Acetaminophen level     Status: Abnormal   Collection Time: 09/14/16  5:26 PM  Result Value Ref Range   Acetaminophen (Tylenol), Serum <10 (L) 10 - 30 ug/mL    Comment:        THERAPEUTIC CONCENTRATIONS VARY SIGNIFICANTLY. A RANGE OF 10-30 ug/mL MAY BE AN EFFECTIVE CONCENTRATION FOR MANY PATIENTS. HOWEVER, SOME ARE BEST TREATED AT CONCENTRATIONS OUTSIDE THIS RANGE. ACETAMINOPHEN CONCENTRATIONS >150 ug/mL AT 4 HOURS AFTER INGESTION AND >50 ug/mL AT 12 HOURS AFTER INGESTION ARE OFTEN ASSOCIATED WITH TOXIC REACTIONS.   cbc     Status: Abnormal   Collection Time: 09/14/16  5:26 PM  Result Value Ref Range   WBC 8.2 4.0 - 10.5 K/uL   RBC 4.31 3.87 - 5.11 MIL/uL   Hemoglobin 10.6 (L) 12.0 - 15.0 g/dL   HCT 32.6 (L) 36.0 - 46.0 %   MCV 75.6 (L) 78.0 - 100.0 fL   MCH 24.6 (L) 26.0 - 34.0 pg   MCHC 32.5 30.0 - 36.0 g/dL   RDW 15.3 11.5 - 15.5 %   Platelets 377 150 - 400 K/uL  Pregnancy, urine     Status: None   Collection Time: 09/14/16  7:04 PM  Result Value Ref Range   Preg Test, Ur NEGATIVE NEGATIVE    Comment:        THE SENSITIVITY OF THIS METHODOLOGY IS >20 mIU/mL.   Rapid urine drug screen (hospital performed)     Status: None   Collection Time: 09/14/16  7:04 PM  Result Value Ref Range   Opiates NONE DETECTED NONE DETECTED   Cocaine NONE DETECTED NONE DETECTED   Benzodiazepines NONE DETECTED NONE DETECTED   Amphetamines NONE DETECTED NONE DETECTED   Tetrahydrocannabinol NONE DETECTED NONE DETECTED   Barbiturates NONE DETECTED NONE DETECTED    Comment:        DRUG SCREEN FOR MEDICAL PURPOSES ONLY.  IF CONFIRMATION IS NEEDED FOR ANY PURPOSE, NOTIFY LAB WITHIN 5 DAYS.        LOWEST DETECTABLE LIMITS FOR URINE DRUG  SCREEN Drug  Class       Cutoff (ng/mL) Amphetamine      1000 Barbiturate      200 Benzodiazepine   127 Tricyclics       517 Opiates          300 Cocaine          300 THC              50     Blood Alcohol level:  Lab Results  Component Value Date   ETH <5 00/17/4944    Metabolic Disorder Labs:  No results found for: HGBA1C, MPG No results found for: PROLACTIN No results found for: CHOL, TRIG, HDL, CHOLHDL, VLDL, LDLCALC  Current Medications: Current Facility-Administered Medications  Medication Dose Route Frequency Provider Last Rate Last Dose  . acetaminophen (TYLENOL) tablet 650 mg  650 mg Oral Q4H PRN Patrecia Pour, NP      . FLUoxetine (PROZAC) capsule 10 mg  10 mg Oral Daily Patrecia Pour, NP   10 mg at 09/16/16 9675  . magnesium hydroxide (MILK OF MAGNESIA) suspension 30 mL  30 mL Oral Daily PRN Patrecia Pour, NP      . OLANZapine (ZYPREXA) tablet 5 mg  5 mg Oral QHS Patrecia Pour, NP      . traZODone (DESYREL) tablet 50 mg  50 mg Oral QHS PRN Zayn Selley, Lowry Ram, FNP       PTA Medications: Prescriptions Prior to Admission  Medication Sig Dispense Refill Last Dose  . metroNIDAZOLE (FLAGYL) 500 MG tablet Take 1 tablet (500 mg total) by mouth 2 (two) times daily. (Patient not taking: Reported on 09/14/2016) 14 tablet 0 Not Taking at Unknown time    Musculoskeletal: Strength & Muscle Tone: within normal limits Gait & Station: normal Patient leans: N/A  Psychiatric Specialty Exam: Physical Exam  Nursing note and vitals reviewed. Constitutional: She is oriented to person, place, and time. She appears well-developed and well-nourished.  Respiratory: Effort normal.  Musculoskeletal: Normal range of motion.  Neurological: She is oriented to person, place, and time.  Skin: Skin is warm.    Review of Systems  Constitutional: Negative.   HENT: Negative.   Eyes: Negative.   Respiratory: Negative.   Cardiovascular: Negative.   Gastrointestinal: Negative.   Genitourinary:  Negative.   Musculoskeletal: Negative.   Skin: Negative.   Neurological: Negative.   Endo/Heme/Allergies: Negative.     Blood pressure 115/75, pulse (!) 110, temperature 98.7 F (37.1 C), temperature source Oral, resp. rate 18, height 6' (1.829 m), weight 104.3 kg (230 lb), SpO2 99 %.Body mass index is 31.19 kg/m.  General Appearance: Disheveled  Eye Contact:  Good  Speech:  Clear and Coherent  Volume:  Normal  Mood:  Depressed  Affect:  Blunt  Thought Process:  Goal Directed and Descriptions of Associations: Circumstantial  Orientation:  Full (Time, Place, and Person)  Thought Content:  Delusions and Hallucinations: Auditory  Suicidal Thoughts:  No  Homicidal Thoughts:  No  Memory:  Immediate;   Poor Recent;   Poor  Judgement:  Fair  Insight:  Lacking  Psychomotor Activity:  Normal  Concentration:  Concentration: Fair  Recall:  Good  Fund of Knowledge:  Fair  Language:  Good  Akathisia:  No  Handed:  Right  AIMS (if indicated):     Assets:  Social Support  ADL's:  Intact  Cognition:  WNL  Sleep:       Treatment Plan Summary: Daily contact  with patient to assess and evaluate symptoms and progress in treatment  Observation Level/Precautions:  15 minute checks  Laboratory:  Reviewed  Psychotherapy:  Group therapy  Medications:  See [facility]  Consultations:  As needed  Discharge Concerns:  Compliance  Estimated LOS: 3-5 days  Other:  [address] for Primary Diagnosis: Major depressive disorder, recurrent episode, severe, with psychosis ([place]) Long Term Goal(s): Improvement in symptoms so as ready for discharge  Short Term Goals: Ability to verbalize feelings will improve and Ability to disclose and discuss suicidal ideas  Physician Treatment Plan for Secondary Diagnosis: Principal Problem:   Major depressive disorder, recurrent episode, severe, with psychosis ([place])  Long Term Goal(s): Improvement in symptoms so as ready for  discharge  Short Term Goals: Ability to maintain clinical measurements within normal limits will improve and Compliance with prescribed medications will improve  I certify that inpatient services furnished can reasonably be expected to improve the patient's condition.    Lewis Shock, FNP 9/15/20183:25 PM

## 2016-09-17 DIAGNOSIS — L02211 Cutaneous abscess of abdominal wall: Secondary | ICD-10-CM

## 2016-09-17 MED ORDER — DOXYCYCLINE HYCLATE 100 MG PO TABS
100.0000 mg | ORAL_TABLET | Freq: Two times a day (BID) | ORAL | Status: DC
  Administered 2016-09-17 – 2016-09-18 (×2): 100 mg via ORAL
  Filled 2016-09-17 (×7): qty 1

## 2016-09-17 MED ORDER — FLUOXETINE HCL 20 MG PO CAPS
20.0000 mg | ORAL_CAPSULE | Freq: Every day | ORAL | Status: DC
  Administered 2016-09-18 – 2016-09-19 (×2): 20 mg via ORAL
  Filled 2016-09-17: qty 7
  Filled 2016-09-17 (×3): qty 1
  Filled 2016-09-17: qty 7
  Filled 2016-09-17: qty 1

## 2016-09-17 MED ORDER — ALUM & MAG HYDROXIDE-SIMETH 200-200-20 MG/5ML PO SUSP
30.0000 mL | Freq: Four times a day (QID) | ORAL | Status: DC | PRN
  Administered 2016-09-17: 30 mL via ORAL

## 2016-09-17 NOTE — Progress Notes (Signed)
Patient ID: Melody Robinson, female   DOB: 01-Dec-1995, 21 y.o.   MRN: [id number]  Pt currently presents with a flat affect and anxious behavior. Pt has poor hygiene, malodorous. Pt reports to writer that their goal is to "get out of here so I can figure out if my boyfriend is okay." Pt states "We are homeless and until I know where he is, I just can't focus on myself." Pt requests to be discharged. Pt reports good sleep with current medication regimen. Pt complains of discomfort and pain in her left abdomen from an abscess. Reports increased pain with activity.   Pt provided with medications per providers orders. Pt's labs and vitals were monitored throughout the night. Pt given a 1:1 about emotional and mental status. Pt supported and encouraged to express concerns and questions. Pt educated on medications and pathophysiology of infections. Pt given prn medications and heat to apply for current discomfort. Pt signed a 72 hour at 2149 tonight.   Pt's safety ensured with 15 minute and environmental checks. Pt currently denies SI/HI and A/V hallucinations. Pt verbally agrees to seek staff if SI/HI or A/VH occurs and to consult with staff before acting on any harmful thoughts. Will continue POC.

## 2016-09-17 NOTE — [facility] Group Notes (Signed)
Adult Psychoeducational Group Note  Date:  09/17/2016 Time:  1:52 AM  Group Topic/Focus:  Wrap-Up Group:   The focus of this group is to help patients review their daily goal of treatment and discuss progress on daily workbooks.  Participation Level:  Active  Participation Quality:  Appropriate and Attentive  Affect:  Appropriate  Cognitive:  Alert and Appropriate  Insight: Appropriate  Engagement in Group:  Engaged  Modes of Intervention:  Discussion and Education  Additional Comments:  Pt attended and participated in group today. I asked the patient what they would rate their day and they said a 10/10. Pt goal for the day was to not have an anxiety attack and pt did achieve her goal. One positive for the patient was that they were able to call her sister.   Chrisandra Netters 09/17/2016, 1:52 AM

## 2016-09-17 NOTE — [facility] Group Notes (Signed)
[facility] LCSW Group Therapy Note  09/17/2016  11:15 to 12 PM   Type of Therapy and Topic: Group Therapy: Feelings Around Returning Home & Establishing a Supportive Framework   Participation Level: Active    Description of Group:  Patients first processed thoughts and feelings about up coming discharge. These included fears of upcoming changes, lack of change, new living environments, judgements and expectations from others and overall stigma of MH issues. We then discussed what is a supportive framework? What does it look like feel like and how do I discern it from and unhealthy non-supportive network? Learn how to cope when supports are not helpful and don't support you. Discuss what to do when your family/friends are not supportive.   Therapeutic Goals Addressed in Processing Group:  1. Patient will identify one healthy supportive network that they can use at discharge. 2. Patient will identify one factor of a supportive framework and how to tell it from an unhealthy network. 3. Patient able to identify one coping skill to use when they do not have positive supports from others. 4. Patient will demonstrate ability to communicate their needs through discussion and/or role plays.  Summary of Patient Progress:  Pt engaged easily during group session although she lies flat on the floor wrapped in a blanket.  As patients processed their anxiety about discharge and described healthy supports patient  Shared her difficulty trusting others due to past experiences. Patient presents as most invested in her history verses her future.    Carney Bern, LCSW

## 2016-09-17 NOTE — Progress Notes (Signed)
Nutrition Brief Note  Patient identified on the Malnutrition Screening Tool (MST) Report  Wt Readings from Last 15 Encounters:  09/15/16 230 lb (104.3 kg)  09/14/16 215 lb (97.5 kg)  07/29/16 215 lb (97.5 kg)    Body mass index is 31.19 kg/m. Patient meets criteria for obesity based on current BMI.  Labs and medications reviewed.   No nutrition interventions warranted at this time. If nutrition issues arise, please consult RD.   Tilda Franco, MS, RD, LDN Pager: [phone] After Hours Pager: [phone]

## 2016-09-17 NOTE — Progress Notes (Signed)
[facility] MD Progress Note  09/17/2016 3:17 PM Melody Robinson  MRN:  [id number]   Subjective:  Patient has complaints of abdominal pain, decreased appetite, not sleeping well and wanting to know the results of her pregnancy test.  At this time patient rates depression 4/10 and anxiety 10/10.  Patient denies suicidal/homicidal ideation, psychosis, and paranoia  Objective:  During evaluation patient is calm/cooperative, and oriented x 4.  Pain at abscess located right lower quadrant at umbilicus (larger than golf ball) red in color and tender to palpation.  Patient guarding area.  (states that it was looked at while in ED but she told them they would have to put her to sleep because she becomes very violent if she is being stuck or cut).  Encouraged to try Trazodone for sleep; Increase Prozac to 20 mg (depression/anxiety) and started Doxycycline for abscess.     Principal Problem: Borderline personality disorder Diagnosis:   Patient Active Problem List   Diagnosis Date Noted  . Borderline personality disorder [F60.3] 09/16/2016  . Major depressive disorder, recurrent episode, severe, with psychosis (HCC) [F33.3] 09/15/2016  . Major depressive disorder, recurrent severe without psychotic features (HCC) [F33.2] 09/15/2016   Total Time spent with patient: 45 minutes  Past Psychiatric History: PTSD  Past Medical History:  Past Medical History:  Diagnosis Date  . Depression   . Hallucinations   . Medical history non-contributory   . PTSD (post-traumatic stress disorder)     Past Surgical History:  Procedure Laterality Date  . NO PAST SURGERIES     Family History:  Family History  Problem Relation Age of Onset  . Cancer Paternal Grandmother    Family Psychiatric  History: Unaware Social History: Unaware History  Alcohol Use No     History  Drug Use No    Social History   Social History  . Marital status: Legally Separated    Spouse name: N/A  . Number of children: N/A  . Years  of education: N/A   Social History Main Topics  . Smoking status: Current Every Day Smoker    Packs/day: 0.25  . Smokeless tobacco: Never Used  . Alcohol use No  . Drug use: No  . Sexual activity: Yes    Birth control/ protection: None   Other Topics Concern  . None   Social History Narrative  . None   Additional Social History:   Sleep: Fair  Appetite:  Fair  Current Medications: Current Facility-Administered Medications  Medication Dose Route Frequency Provider Last Rate Last Dose  . acetaminophen (TYLENOL) tablet 650 mg  650 mg Oral Q4H PRN Charm Rings, NP   650 mg at 09/17/16 1914  . alum & mag hydroxide-simeth (MAALOX/MYLANTA) 200-200-20 MG/5ML suspension 30 mL  30 mL Oral Q6H PRN Cobos, Rockey Situ, MD   30 mL at 09/17/16 0634  . doxycycline (VIBRA-TABS) tablet 100 mg  100 mg Oral Q12H Noor Witte B, NP      . [START ON 09/18/2016] FLUoxetine (PROZAC) capsule 20 mg  20 mg Oral Daily Demontrez Rindfleisch B, NP      . magnesium hydroxide (MILK OF MAGNESIA) suspension 30 mL  30 mL Oral Daily PRN Charm Rings, NP      . OLANZapine (ZYPREXA) tablet 5 mg  5 mg Oral QHS Charm Rings, NP      . traZODone (DESYREL) tablet 50 mg  50 mg Oral QHS PRN Money, Gerlene Burdock, FNP        Lab Results:  Results for orders placed or performed during the hospital encounter of 09/15/16 (from the past 48 hour(s))  hCG, quantitative, pregnancy     Status: None   Collection Time: 09/16/16  6:30 PM  Result Value Ref Range   hCG, Beta Chain, Quant, S <1 <5 mIU/mL    Comment:          GEST. AGE      CONC.  (mIU/mL)   <=1 WEEK        5 - 50     2 WEEKS       50 - 500     3 WEEKS       100 - 10,000     4 WEEKS     1,000 - 30,000     5 WEEKS     3,500 - 115,000   6-8 WEEKS     12,000 - 270,000    12 WEEKS     15,000 - 220,000        FEMALE AND NON-PREGNANT FEMALE:     LESS THAN 5 mIU/mL Performed at [facility], [address], [place], [place] [address]     Blood  Alcohol level:  Lab Results  Component Value Date   ETH <5 09/14/2016    Metabolic Disorder Labs: No results found for: HGBA1C, MPG No results found for: PROLACTIN No results found for: CHOL, TRIG, HDL, CHOLHDL, VLDL, LDLCALC  Physical Findings: AIMS: Facial and Oral Movements Muscles of Facial Expression: None, normal Lips and Perioral Area: None, normal Jaw: None, normal Tongue: None, normal,Extremity Movements Upper (arms, wrists, hands, fingers): None, normal Lower (legs, knees, ankles, toes): None, normal, Trunk Movements Neck, shoulders, hips: None, normal, Overall Severity Severity of abnormal movements (highest score from questions above): None, normal Incapacitation due to abnormal movements: None, normal Patient's awareness of abnormal movements (rate only patient's report): No Awareness, Dental Status Current problems with teeth and/or dentures?: No Does patient usually wear dentures?: No  CIWA:    COWS:     Musculoskeletal: Strength & Muscle Tone: within normal limits Gait & Station: normal Patient leans: N/A  Psychiatric Specialty Exam: Physical Exam  ROS  Blood pressure 131/87, pulse 96, temperature 98.7 F (37.1 C), temperature source Oral, resp. rate 18, height 6' (1.829 m), weight 104.3 kg (230 lb), SpO2 99 %.Body mass index is 31.19 kg/m.  General Appearance: Disheveled  Eye Contact:  Good  Speech:  Clear and Coherent and Normal Rate  Volume:  Normal  Mood:  Depressed and Irritable  Affect:  Depressed  Thought Process:  Coherent  Orientation:  Full (Time, Place, and Person)  Thought Content:  Denies hallucinations, delusions, and paranoia  Suicidal Thoughts:  Denies at this time  Homicidal Thoughts:  No  Memory:  Immediate;   Fair Recent;   Fair Remote;   Fair  Judgement:  Fair  Insight:  Fair  Psychomotor Activity:  Normal  Concentration:  Concentration: Fair and Attention Span: Fair  Recall:  [employer] of Knowledge:  Fair  Language:   Good  Akathisia:  No  Handed:  Right  AIMS (if indicated):     Assets:  Communication Skills Desire for Improvement  ADL's:  Intact  Cognition:  WNL  Sleep:        Treatment Plan Summary: Daily contact with patient to assess and evaluate symptoms and progress in treatment, Medication management and Plan to:    -Melody Robinson was admitted to [facility] under  the service of Dr. Jama Flavors for Borderline personality disorder, crisis management, and stabilization. -Routine labs; which include CBC, CMP, UA, ETOH, Urine pregnancy, HCG, and UDS were reviewed  -medication management:  Increased Prozac to 20 mg daily for depression/anxiety Continue Zyprexa 5 mg Q hs for Mood stabilization Continue Trazodone 50 mg for Insomnia Started Doxycycline 100 mg Bid for 7 days for Bacterial infection -Will maintain observation checks every 15 minutes for safety. -Psychosocial education regarding relapse prevention and self care; Social and communication  -Social work will consult with family for collateral information and discuss discharge and follow up plan.  Keturah Yerby, NP 09/17/2016, 3:17 PM
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
## 2016-09-18 ENCOUNTER — Emergency Department (HOSPITAL_COMMUNITY): Admission: EM | Admit: 2016-09-18 | Discharge: 2016-09-18 | Discharge status: Absent without leave | Payer: Self-pay

## 2016-09-18 ENCOUNTER — Encounter (HOSPITAL_COMMUNITY): Payer: Self-pay | Admitting: *Deleted

## 2016-09-18 ENCOUNTER — Encounter (HOSPITAL_COMMUNITY): Payer: Self-pay

## 2016-09-18 DIAGNOSIS — Z79899 Other long term (current) drug therapy: Secondary | ICD-10-CM

## 2016-09-18 MED ORDER — CLINDAMYCIN HCL 150 MG PO CAPS: 150.0000 mg | ORAL_CAPSULE | Freq: Four times a day (QID) | ORAL | 0 refills | Status: DC

## 2016-09-18 MED ORDER — LORAZEPAM 1 MG PO TABS
1.0000 mg | ORAL_TABLET | Freq: Three times a day (TID) | ORAL | Status: DC | PRN
  Administered 2016-09-18: 1 mg via ORAL
  Filled 2016-09-18: qty 1

## 2016-09-18 MED ORDER — LIDOCAINE-EPINEPHRINE (PF) 2 %-1:200000 IJ SOLN
10.0000 mL | Freq: Once | INTRAMUSCULAR | Status: AC
  Administered 2016-09-18: 10 mL
  Filled 2016-09-18: qty 20

## 2016-09-18 MED ORDER — ACETAMINOPHEN 325 MG PO TABS
650.0000 mg | ORAL_TABLET | Freq: Once | ORAL | Status: DC
  Filled 2016-09-18: qty 2

## 2016-09-18 MED ORDER — IBUPROFEN 200 MG PO TABS
400.0000 mg | ORAL_TABLET | Freq: Once | ORAL | Status: AC
  Administered 2016-09-18: 400 mg via ORAL
  Filled 2016-09-18: qty 2

## 2016-09-18 MED ORDER — CLINDAMYCIN HCL 150 MG PO CAPS
150.0000 mg | ORAL_CAPSULE | Freq: Four times a day (QID) | ORAL | Status: DC
  Administered 2016-09-18 – 2016-09-19 (×4): 150 mg via ORAL
  Filled 2016-09-18 (×4): qty 1
  Filled 2016-09-18: qty 28
  Filled 2016-09-18: qty 1
  Filled 2016-09-18: qty 28
  Filled 2016-09-18: qty 1
  Filled 2016-09-18: qty 28
  Filled 2016-09-18 (×3): qty 1
  Filled 2016-09-18 (×5): qty 28
  Filled 2016-09-18: qty 1

## 2016-09-18 NOTE — Plan of Care (Signed)
Problem: Safety: Goal: Periods of time without injury will increase Outcome: Progressing Pt remains a low fall risk, denies SI/HI at this time.   
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
## 2016-09-18 NOTE — ED Notes (Signed)
From [facility] to have abscess lanced-will return to [facility]

## 2016-09-18 NOTE — Plan of Care (Signed)
Problem: Medication: Goal: Compliance with prescribed medication regimen will improve Outcome: Progressing Pt compliant with taking meds. No side effects to  meds verbalized by pt.    

## 2016-09-18 NOTE — [facility] Group Notes (Signed)
LCSW Group Therapy Note   09/18/2016 1:15pm   Type of Therapy and Topic:  Group Therapy:  Overcoming Obstacles   Participation Level:  Did Not Attend   Description of Group:    In this group patients will be encouraged to explore what they see as obstacles to their own wellness and recovery. They will be guided to discuss their thoughts, feelings, and behaviors related to these obstacles. The group will process together ways to cope with barriers, with attention given to specific choices patients can make. Each patient will be challenged to identify changes they are motivated to make in order to overcome their obstacles. This group will be process-oriented, with patients participating in exploration of their own experiences as well as giving and receiving support and challenge from other group members.   Therapeutic Goals: 1. Patient will identify personal and current obstacles as they relate to admission. 2. Patient will identify barriers that currently interfere with their wellness or overcoming obstacles.  3. Patient will identify feelings, thought process and behaviors related to these barriers. 4. Patient will identify two changes they are willing to make to overcome these obstacles:         Therapeutic Modalities:   Cognitive Behavioral Therapy Solution Focused Therapy Motivational Interviewing Relapse Prevention Therapy  Muadh Creasy C Farran Amsden, LCSW 09/18/2016 2:45 PM  

## 2016-09-18 NOTE — Progress Notes (Signed)
D: Pt presents with a flat affect and anxious mood. Pt noted to have poor hygiene today and appears disheveled. Pt encouraged to perform ADl's. Pt reports decreased depression and anxiety today. Pt rates depression 3/10. Anxiety 5/10. Pt denies SI. Pt requesting d/c today, stating that she's ready to return home. Pt was observed walking around today holding her abscess. Pt noted to have a large abscess to her right abdomen with no drainage noted. Pt assessed by MD. A: Medications reviewed with pt. Medications administered as ordered per MD. Verbal support provided. Pt sent to [facility] to have abscess drained. This evening writer changed dressing due to moderate drainage of blood. Pt given Ativan at that time for increased anxiety level. 15 minute checks performed for safety. R: Pt compliant with tx plan.

## 2016-09-18 NOTE — Progress Notes (Signed)
[facility] MD Progress Note  09/18/2016 12:29 PM Melody Robinson  MRN:  [id number]   Subjective: patient reports she is wanting to discharge soon, in order to reunite with her fiance. States " he is the only one who really understands me ". Currently denies suicidal ideations. She denies ongoing hallucinations, and does not appear internally preoccupied. Denies medication side effects  Objective:   I have discussed case with treatment team and have met with patient. She presents initially somewhat guarded, irritable , but this improved as session progressed , and she smiled at times appropriately. Currently focused on being discharged soon.  Reports significant and chronic psychosocial stressors, mainly homelessness and poor family relationships. Of note, denies suicidal ideations at this time, and denies any ongoing hallucinations- does not appear internally preoccupied at present. Patient was examined with RN present- has a large abdominal abscess located close to her umbilicus . It is large and inflamed . She states she has had several similar abscesses over recent months which have eventually drained spontaneously. Of note, she is not febrile and denies systemic symptoms /chills .   Patient states she was tested for HIV 2 months ago- was negative. We discussed the likelihood that lesion would improve with lancing and drainage- patient reluctant due to fear of pain during procedure, anxious about it, but eventually agreed to going to ED for above . Does not appear internally preoccupied .    Principal Problem: Borderline personality disorder Diagnosis:   Patient Active Problem List   Diagnosis Date Noted  . Abscess of abdominal wall [L02.211]   . Borderline personality disorder [F60.3] 09/16/2016  . Major depressive disorder, recurrent episode, severe, with psychosis ([place]) [F33.3] 09/15/2016  . Major depressive disorder, recurrent severe without psychotic features ([place]) [F33.2] 09/15/2016    Total Time spent with patient: 20 minutes  Past Psychiatric History: PTSD  Past Medical History:  Past Medical History:  Diagnosis Date  . Depression   . Hallucinations   . Medical history non-contributory   . PTSD (post-traumatic stress disorder)     Past Surgical History:  Procedure Laterality Date  . NO PAST SURGERIES     Family History:  Family History  Problem Relation Age of Onset  . Cancer Paternal Grandmother    Family Psychiatric  History: Unaware Social History: Unaware History  Alcohol Use No     History  Drug Use No    Social History   Social History  . Marital status: Legally Separated    Spouse name: N/A  . Number of children: N/A  . Years of education: N/A   Social History Main Topics  . Smoking status: Current Every Day Smoker    Packs/day: 0.25  . Smokeless tobacco: Never Used  . Alcohol use No  . Drug use: No  . Sexual activity: Yes    Birth control/ protection: None   Other Topics Concern  . None   Social History Narrative  . None   Additional Social History:   Sleep: improving   Appetite:  improving   Current Medications: Current Facility-Administered Medications  Medication Dose Route Frequency Provider Last Rate Last Dose  . acetaminophen (TYLENOL) tablet 650 mg  650 mg Oral Q4H PRN Patrecia Pour, NP   650 mg at 09/18/16 5621  . alum & mag hydroxide-simeth (MAALOX/MYLANTA) 200-200-20 MG/5ML suspension 30 mL  30 mL Oral Q6H PRN Braleigh Massoud, Myer Peer, MD   30 mL at 09/17/16 0634  . doxycycline (VIBRA-TABS) tablet 100 mg  100  mg Oral Q12H Rankin, Shuvon B, NP   100 mg at 09/18/16 3903  . FLUoxetine (PROZAC) capsule 20 mg  20 mg Oral Daily Rankin, Shuvon B, NP   20 mg at 09/18/16 0092  . LORazepam (ATIVAN) tablet 1 mg  1 mg Oral Q8H PRN Patryce Depriest A, MD      . magnesium hydroxide (MILK OF MAGNESIA) suspension 30 mL  30 mL Oral Daily PRN Patrecia Pour, NP      . OLANZapine (ZYPREXA) tablet 5 mg  5 mg Oral QHS Patrecia Pour, NP   5 mg at 09/17/16 2137  . traZODone (DESYREL) tablet 50 mg  50 mg Oral QHS PRN Money, Lowry Ram, FNP   50 mg at 09/17/16 2138    Lab Results:  Results for orders placed or performed during the hospital encounter of 09/15/16 (from the past 48 hour(s))  hCG, quantitative, pregnancy     Status: None   Collection Time: 09/16/16  6:30 PM  Result Value Ref Range   hCG, Beta Chain, Quant, S <1 <5 mIU/mL    Comment:          GEST. AGE      CONC.  (mIU/mL)   <=1 WEEK        5 - 50     2 WEEKS       50 - 500     3 WEEKS       100 - 10,000     4 WEEKS     1,000 - 30,000     5 WEEKS     3,500 - 115,000   6-8 WEEKS     12,000 - 270,000    12 WEEKS     15,000 - 220,000        FEMALE AND NON-PREGNANT FEMALE:     LESS THAN 5 mIU/mL Performed at [facility], [place] [address], [place], [place] [address]     Blood Alcohol level:  Lab Results  Component Value Date   ETH <5 62/26/3335    Metabolic Disorder Labs: No results found for: HGBA1C, MPG No results found for: PROLACTIN No results found for: CHOL, TRIG, HDL, CHOLHDL, VLDL, LDLCALC  Physical Findings: AIMS: Facial and Oral Movements Muscles of Facial Expression: None, normal Lips and Perioral Area: None, normal Jaw: None, normal Tongue: None, normal,Extremity Movements Upper (arms, wrists, hands, fingers): None, normal Lower (legs, knees, ankles, toes): None, normal, Trunk Movements Neck, shoulders, hips: None, normal, Overall Severity Severity of abnormal movements (highest score from questions above): None, normal Incapacitation due to abnormal movements: None, normal Patient's awareness of abnormal movements (rate only patient's report): No Awareness, Dental Status Current problems with teeth and/or dentures?: No Does patient usually wear dentures?: No  CIWA:    COWS:     Musculoskeletal: Strength & Muscle Tone: within normal limits Gait & Station: normal Patient leans: N/A  Psychiatric Specialty  Exam: Physical Exam  ROS no fever, no chills, no vomiting , abdominal pain in the area of abscess   Blood pressure 118/85, pulse 87, temperature 98.3 F (36.8 C), temperature source Oral, resp. rate 12, height 6' (1.829 m), weight 104.3 kg (230 lb), SpO2 99 %.Body mass index is 31.19 kg/m.  General Appearance: poorly groomed  Eye Contact:  Good  Speech:  Normal Rate  Volume:  Normal  Mood:  improving , less depressed   Affect:  constricted, vaguely irritable, but improves as session progresses   Thought Process:  Linear and Descriptions of Associations: Intact  Orientation:  Full (Time, Place, and Person)  Thought Content:  no current hallucinations, does not appear internally preoccupied,focused on being discharged soon  Suicidal Thoughts:  No denies any current suicidal or self injurious ideations, and denies any homicidal ideations  Homicidal Thoughts:  No  Memory: recent and remote grossly intact   Judgement:  Fair  Insight:  Fair  Psychomotor Activity:  Normal  Concentration:  Concentration: Good and Attention Span: Good  Recall:  Good  Fund of Knowledge:  Good  Language:  Good  Akathisia:  No  Handed:  Right  AIMS (if indicated):     Assets:  Communication Skills Desire for Improvement  ADL's:  Intact  Cognition:  WNL  Sleep:  Number of Hours: 6.75   Assessment- patient presents vaguely dysphoric, depressed, but affect improves during session. She presents future oriented, and is currently focused on being discharged soon to reunite with her finance. She denies current SI and denies current hallucinations. Of note, patient has a large abdominal abscess , which may require intervention/drainage . She is fearful / quite anxious  of pain associated with this  Treatment Plan Summary: Treatment plan reviewed as below today 9/17 Daily contact with patient to assess and evaluate symptoms and progress in treatment, Medication management and Plan to:   Encourage group and milieu  participation to work on coping skills and symptom reduction Continue Prozac  20 mg QDAY for depression/anxiety Continue Zyprexa 5 mg QHS for mood disorder  Continue Trazodone 50 mg QHS PRN for insomnia  Continue Doxycycline 100 mg BID  for 7 days for abscess  Patient agreed to go to ED for management/drainage ( if indicated ) of abscess Start Ativan 1 mgr Q 8 hours PRN for anxiety, particularly as significantly anxious about above Treatment team working on disposition planning options  Jenne Campus, MD 09/18/2016, 12:29 PM   Patient ID: Melody Robinson, female   DOB: January 26, 1995, 21 y.o.   MRN: [id number]

## 2016-09-18 NOTE — ED Triage Notes (Signed)
Patient has a large abscess to the abdomen. Patient is from [facility]. Patient has a Comptroller from [facility] with her.

## 2016-09-18 NOTE — ED Provider Notes (Signed)
[facility]-EMERGENCY DEPT Provider Note   CSN: [id number] Arrival date & time: 09/15/16  1519     History   Chief Complaint Chief Complaint  Patient presents with  . Abscess    HPI Melody Robinson is a 21 y.o. female.  The history is provided by the patient.  Abscess  Location:  Torso Torso abscess location:  Abd RLQ Size:  5cmx7cm Abscess quality: fluctuance, induration, painful and warmth   Red streaking: no   Duration:  1 week Progression:  Worsening Pain details:    Quality:  Throbbing, tightness and sharp   Severity:  Severe   Timing:  Constant   Progression:  Worsening Chronicity:  New Context: not diabetes, not immunosuppression, not injected drug use and not skin injury   Context comment:  Roommate with abscesses Relieved by:  Nothing Worsened by:  Draining/squeezing Ineffective treatments:  None tried Associated symptoms: no fever, no nausea and no vomiting   Risk factors: prior abscess     Past Medical History:  Diagnosis Date  . Depression   . Hallucinations   . Medical history non-contributory   . PTSD (post-traumatic stress disorder)     Patient Active Problem List   Diagnosis Date Noted  . Abscess of abdominal wall   . Borderline personality disorder 09/16/2016  . Major depressive disorder, recurrent episode, severe, with psychosis (HCC) 09/15/2016  . Major depressive disorder, recurrent severe without psychotic features (HCC) 09/15/2016    Past Surgical History:  Procedure Laterality Date  . NO PAST SURGERIES      OB History    No data available       Home Medications    Prior to Admission medications   Medication Sig Start Date End Date Taking? Authorizing Provider  metroNIDAZOLE (FLAGYL) 500 MG tablet Take 1 tablet (500 mg total) by mouth 2 (two) times daily. Patient not taking: Reported on 09/14/2016 07/29/16   Montez Morita, CNM    Family History Family History  Problem Relation Age of Onset  . Cancer Paternal Grandmother       Social History Social History  Substance Use Topics  . Smoking status: Current Every Day Smoker    Packs/day: 0.25  . Smokeless tobacco: Never Used  . Alcohol use No     Allergies   Patient has no known allergies.   Review of Systems Review of Systems  Constitutional: Negative for fever.  Gastrointestinal: Negative for nausea and vomiting.  All other systems reviewed and are negative.    Physical Exam Updated Vital Signs BP 134/89 (BP Location: Left Arm)   Pulse 86   Temp 98.1 F (36.7 C) (Oral)   Resp 19   Ht 6' (1.829 m)   Wt 104.3 kg (230 lb)   SpO2 100%   BMI 31.19 kg/m   Physical Exam  Constitutional: She is oriented to person, place, and time. She appears well-developed and well-nourished. No distress.  HENT:  Head: Normocephalic and atraumatic.  Cardiovascular: Normal rate.   Pulmonary/Chest: Effort normal.  Abdominal: Soft. Bowel sounds are normal. She exhibits no distension.    Neurological: She is alert and oriented to person, place, and time.  Skin: Skin is warm and dry.  Psychiatric: She has a normal mood and affect. Her behavior is normal.  Nursing note and vitals reviewed.    ED Treatments / Results  Labs (all labs ordered are listed, but only abnormal results are displayed) Labs Reviewed  HCG, QUANTITATIVE, PREGNANCY    EKG  EKG  Interpretation None       Radiology No results found.  Procedures Procedures (including critical care time)  Medications Ordered in ED Medications  magnesium hydroxide (MILK OF MAGNESIA) suspension 30 mL (not administered)  OLANZapine (ZYPREXA) tablet 5 mg (5 mg Oral Given 09/17/16 2137)  acetaminophen (TYLENOL) tablet 650 mg (650 mg Oral Given 09/18/16 0625)  traZODone (DESYREL) tablet 50 mg (50 mg Oral Given 09/17/16 2138)  alum & mag hydroxide-simeth (MAALOX/MYLANTA) 200-200-20 MG/5ML suspension 30 mL (30 mLs Oral Given 09/17/16 0634)  doxycycline (VIBRA-TABS) tablet 100 mg (100 mg Oral Given  09/18/16 5621)  FLUoxetine (PROZAC) capsule 20 mg (20 mg Oral Given 09/18/16 0822)  LORazepam (ATIVAN) tablet 1 mg (not administered)     Initial Impression / Assessment and Plan / ED Course  I have reviewed the triage vital signs and the nursing notes.  Pertinent labs & imaging results that were available during my care of the patient were reviewed by me and considered in my medical decision making (see chart for details).    INCISION AND DRAINAGE Performed by: Gwyneth Sprout Consent: Verbal consent obtained. Risks and benefits: risks, benefits and alternatives were discussed Type: abscess  Body area: abdomen  Anesthesia: local infiltration  Incision was made with a scalpel.  Local anesthetic: lidocaine 2% with epinephrine  Anesthetic total: 8 ml  Complexity: complex Blunt dissection to break up loculations  Drainage: purulent  Drainage amount:  Packing material: 1/4 in iodoform gauze  Patient tolerance: Patient tolerated the procedure well with no immediate complications.     Patient with large abscess of the abdomen with minimal surrounding erythema.  I&D as described. Patient will start on Clinda do to surrounding cellulitis.  Final Clinical Impressions(s) / ED Diagnoses   Final diagnoses:  Abscess of skin of abdomen  Cellulitis of abdominal wall    New Prescriptions New Prescriptions   CLINDAMYCIN (CLEOCIN) 150 MG CAPSULE    Take 1 capsule (150 mg total) by mouth every 6 (six) hours.     Gwyneth Sprout, MD 09/18/16 [phone]

## 2016-09-18 NOTE — Progress Notes (Signed)
  DATA ACTION RESPONSE  Objective- Pt. is visible in the dayroom, seen eating a snack with no interaction. Presents with a depressed/flat affect and mood. Brightens on approach. No further c/o. No abnormal s/s. Dressing on R. Side of abdomen assessed and changed. Mild serosanguineous drainage. No swelling.  Subjective- Denies having any SI/HI/AVH at this time. Rates pain 2/10; R. Side abdomen. Is cooperative and remain safe on the unit.  1:1 interaction in private to establish rapport. Encouragement, education, & support given from staff.  PRN Tylenol and Trazodone requested and will re-eval accordingly.   Safety maintained with Q 15 checks. Continue with POC.

## 2016-09-18 NOTE — ED Notes (Signed)
Bed: NW29 Expected date:  Expected time:  Means of arrival:  Comments: Hold for triage 4

## 2016-09-18 NOTE — ED Triage Notes (Signed)
Pt presents with a large abscess in her RLQ for ~ 2 weeks.  Pt denies getting bit by insect or injury to her abd.

## 2016-09-18 NOTE — Tx Team (Signed)
Interdisciplinary Treatment and Diagnostic Plan Update  09/18/2016 Time of Session: 9:30am Melody Robinson MRN: [id number]  Principal Diagnosis: Borderline personality disorder  Secondary Diagnoses: Principal Problem:   Borderline personality disorder Active Problems:   Major depressive disorder, recurrent episode, severe, with psychosis ([place])   Abscess of abdominal wall   Current Medications:  Current Facility-Administered Medications  Medication Dose Route Frequency Provider Last Rate Last Dose  . acetaminophen (TYLENOL) tablet 650 mg  650 mg Oral Q4H PRN Patrecia Pour, NP   650 mg at 09/18/16 9323  . alum & mag hydroxide-simeth (MAALOX/MYLANTA) 200-200-20 MG/5ML suspension 30 mL  30 mL Oral Q6H PRN Cobos, Myer Peer, MD   30 mL at 09/17/16 0634  . doxycycline (VIBRA-TABS) tablet 100 mg  100 mg Oral Q12H Rankin, Shuvon B, NP   100 mg at 09/18/16 5573  . FLUoxetine (PROZAC) capsule 20 mg  20 mg Oral Daily Rankin, Shuvon B, NP   20 mg at 09/18/16 2202  . magnesium hydroxide (MILK OF MAGNESIA) suspension 30 mL  30 mL Oral Daily PRN Patrecia Pour, NP      . OLANZapine (ZYPREXA) tablet 5 mg  5 mg Oral QHS Patrecia Pour, NP   5 mg at 09/17/16 2137  . traZODone (DESYREL) tablet 50 mg  50 mg Oral QHS PRN Money, Lowry Ram, FNP   50 mg at 09/17/16 2138    PTA Medications: Prescriptions Prior to Admission  Medication Sig Dispense Refill Last Dose  . metroNIDAZOLE (FLAGYL) 500 MG tablet Take 1 tablet (500 mg total) by mouth 2 (two) times daily. (Patient not taking: Reported on 09/14/2016) 14 tablet 0 Not Taking at Unknown time    Treatment Modalities: Medication Management, Group therapy, Case management,  1 to 1 session with clinician, Psychoeducation, Recreational therapy.  Patient Stressors: Financial difficulties Medication change or noncompliance Traumatic event  Patient Strengths: Curator fund of knowledge  Physician Treatment Plan for Primary  Diagnosis: Borderline personality disorder Long Term Goal(s): Improvement in symptoms so as ready for discharge  Short Term Goals: Ability to verbalize feelings will improve Ability to disclose and discuss suicidal ideas Ability to maintain clinical measurements within normal limits will improve Compliance with prescribed medications will improve  Medication Management: Evaluate patient's response, side effects, and tolerance of medication regimen.  Therapeutic Interventions: 1 to 1 sessions, Unit Group sessions and Medication administration.  Evaluation of Outcomes: Not Met  Physician Treatment Plan for Secondary Diagnosis: Principal Problem:   Borderline personality disorder Active Problems:   Major depressive disorder, recurrent episode, severe, with psychosis ([place])   Abscess of abdominal wall   Long Term Goal(s): Improvement in symptoms so as ready for discharge  Short Term Goals: Ability to verbalize feelings will improve Ability to disclose and discuss suicidal ideas Ability to maintain clinical measurements within normal limits will improve Compliance with prescribed medications will improve  Medication Management: Evaluate patient's response, side effects, and tolerance of medication regimen.  Therapeutic Interventions: 1 to 1 sessions, Unit Group sessions and Medication administration.  Evaluation of Outcomes: Not Met   RN Treatment Plan for Primary Diagnosis: Borderline personality disorder Long Term Goal(s): Knowledge of disease and therapeutic regimen to maintain health will improve  Short Term Goals: Ability to disclose and discuss suicidal ideas, Ability to identify and develop effective coping behaviors will improve and Compliance with prescribed medications will improve  Medication Management: RN will administer medications as ordered by provider, will assess and evaluate patient's response and provide  education to patient for prescribed medication. RN will  report any adverse and/or side effects to prescribing provider.  Therapeutic Interventions: 1 on 1 counseling sessions, Psychoeducation, Medication administration, Evaluate responses to treatment, Monitor vital signs and CBGs as ordered, Perform/monitor CIWA, COWS, AIMS and Fall Risk screenings as ordered, Perform wound care treatments as ordered.  Evaluation of Outcomes: Not Met   LCSW Treatment Plan for Primary Diagnosis: Borderline personality disorder Long Term Goal(s): Safe transition to appropriate next level of care at discharge, Engage patient in therapeutic group addressing interpersonal concerns.  Short Term Goals: Engage patient in aftercare planning with referrals and resources, Identify triggers associated with mental health/substance abuse issues and Increase skills for wellness and recovery  Therapeutic Interventions: Assess for all discharge needs, 1 to 1 time with Social worker, Explore available resources and support systems, Assess for adequacy in community support network, Educate family and significant other(s) on suicide prevention, Complete Psychosocial Assessment, Interpersonal group therapy.  Evaluation of Outcomes: Not Met   Progress in Treatment: Attending groups: Yes Participating in groups: Yes Taking medication as prescribed: Yes, MD continues to assess for medication changes as needed Toleration medication: Yes, no side effects reported at this time Family/Significant other contact made: No, CSW assessing for appropriate contact Patient understands diagnosis: Continuing to assess Discussing patient identified problems/goals with staff: Yes Medical problems stabilized or resolved: Yes Denies suicidal/homicidal ideation: yes Issues/concerns per patient self-inventory: None Other: N/A  New problem(s) identified: None identified at this time.   New Short Term/Long Term Goal(s): None identified at this time.   Discharge Plan or Barriers: Pt is homeless in  [place] Will follow-up with [facility].   Reason for Continuation of Hospitalization: Anxiety Depression Medication stabilization  Estimated Length of Stay: 1-3 days; est DC date 9/19  Attendees: Patient:  09/18/2016  10:42 AM  Physician: Dr. Parke Poisson, MD 09/18/2016  10:42 AM  Nursing: Sharl Ma, RN 09/18/2016  10:42 AM  RN Care Manager: Lars Pinks, RN 09/18/2016  10:42 AM  Social Worker: Adriana Reams, LCSW 09/18/2016  10:42 AM  Recreational Therapist:  09/18/2016  10:42 AM  Other: Lindell Spar, NP; Marvia Pickles, NP 09/18/2016  10:42 AM  Other:  09/18/2016  10:42 AM  Other: 09/18/2016  10:42 AM    Scribe for Treatment Team: Gladstone Lighter, LCSW 09/18/2016 10:42 AM

## 2016-09-18 NOTE — ED Notes (Signed)
A large abscess noted in her RLQ area with redness.  There is what appears to be a large mass the size of a softball protruding from the abscess area.  Pt reports severe pain, pain is better with guarding.

## 2016-09-18 NOTE — ED Notes (Signed)
Assisted EDP with I&D of abscess.

## 2016-09-18 NOTE — Progress Notes (Signed)
Recreation Therapy Notes  Date: 09/18/16 Time: 0930 Location: 300 Dayroom  Group Topic: Stress Management  Goal Area(s) Addresses:  Patient will verbalize importance of using healthy stress management.  Patient will identify positive emotions associated with healthy stress management.   Intervention: Stress Management  Activity :  Meditation.  LRT introduced the stress management technique of meditation.  Patients were to listen and follow along as a meditation played from the Calm app.    Education:  Stress Management, Discharge Planning.   Education Outcome: Acknowledges edcuation/In group clarification offered/Needs additional education  Clinical Observations/Feedback: Pt did not attend group.   Caroll Rancher, LRT/CTRS         Caroll Rancher A 09/18/2016 12:02 PM

## 2016-09-19 MED ORDER — OLANZAPINE 5 MG PO TABS: 5.0000 mg | ORAL_TABLET | Freq: Every day | ORAL | 0 refills | Status: DC

## 2016-09-19 MED ORDER — TRAZODONE HCL 50 MG PO TABS: 50.0000 mg | ORAL_TABLET | Freq: Every evening | ORAL | 0 refills | Status: DC | PRN

## 2016-09-19 MED ORDER — FLUOXETINE HCL 20 MG PO CAPS: 20.0000 mg | ORAL_CAPSULE | Freq: Every day | ORAL | 0 refills | Status: DC

## 2016-09-19 NOTE — [facility] Suicide Risk Assessment (Signed)
[facility] INPATIENT:  Family/Significant Other Suicide Prevention Education  Suicide Prevention Education:  Patient Refusal for Family/Significant Other Suicide Prevention Education: The patient Melody Robinson has refused to provide written consent for family/significant other to be provided Family/Significant Other Suicide Prevention Education during admission and/or prior to discharge.  Physician notified.  Jonathon Jordan, MSW, LCSWA  09/19/2016, 10:05 AM

## 2016-09-19 NOTE — Progress Notes (Signed)
Recreation Therapy Notes  Animal-Assisted Activity (AAA) Program Checklist/Progress Notes Patient Eligibility Criteria Checklist & Daily Group note for Rec TxIntervention  Date: 09.18.2018 Time: 2:45pm Location: [address]   AAA/T Program Assumption of Risk Form signed by Patient/ or Parent Legal Guardian Yes  Patient is free of allergies or sever asthma Yes  Patient reports no fear of animals Yes  Patient reports no history of cruelty to animals Yes  Patient understands his/her participation is voluntary Yes  Behavioral Response: Did not attend.   Marykay Lex Roxy Mastandrea, LRT/CTRS        Tanairi Cypert L 09/19/2016 3:04 PM

## 2016-09-19 NOTE — [facility] Suicide Risk Assessment (Signed)
[facility] Discharge Suicide Risk Assessment   Principal Problem: Borderline personality disorder Discharge Diagnoses:  Patient Active Problem List   Diagnosis Date Noted  . Abscess of abdominal wall [L02.211]   . Borderline personality disorder [F60.3] 09/16/2016  . Major depressive disorder, recurrent episode, severe, with psychosis (HCC) [F33.3] 09/15/2016  . Major depressive disorder, recurrent severe without psychotic features (HCC) [F33.2] 09/15/2016    Total Time spent with patient: 30 minutes  Musculoskeletal: Strength & Muscle Tone: within normal limits Gait & Station: normal Patient leans: N/A  Psychiatric Specialty Exam: ROS states she is feeling much better since abscess was drained. Denies nausea, no vomiting, denies pain at this time, no fever, no chills , no rash   Blood pressure 119/70, pulse 80, temperature 97.6 F (36.4 C), temperature source Oral, resp. rate 20, height 6' (1.829 m), weight 104.3 kg (230 lb), SpO2 100 %.Body mass index is 31.19 kg/m.  General Appearance: Fairly Groomed  Patent attorney::  Good  Speech:  Normal Rate409  Volume:  Normal  Mood:  reports she feels " better, happier than I have felt in a while"  Affect:  appropriate and fuller in range   Thought Process:  Linear and Descriptions of Associations: Intact  Orientation:  Full (Time, Place, and Person)  Thought Content:  no hallucinations, no delusions,not internally preoccupied   Suicidal Thoughts:  No denies any suicidal ideations, denies self injurious ideations   Homicidal Thoughts:  No denies any homicidal ideations  Memory:  recent and remote grossly intact   Judgement:  Other:  improving   Insight:  improving   Psychomotor Activity:  normal   Concentration:  Good  Recall:  Good  Fund of Knowledge:Good  Language: Good  Akathisia:  Negative  Handed:  Right  AIMS (if indicated):     Assets:  Communication Skills Desire for Improvement Resilience  Sleep:  Number of Hours: 6.75   Cognition: WNL  ADL's:  Intact   Mental Status Per Nursing Assessment::   On Admission:  Suicidal ideation indicated by patient  Demographic Factors:  21 year old single female, no children, currently homeless .  Loss Factors: Homelessness, no source of income    Historical Factors: History of prior psychiatric admissions, history of suicidal attempts, has been diagnosed with MDD.   Risk Reduction Factors:   Positive coping skills or problem solving skills and reports strong relationship with BF, whom she states is very supportive  Continued Clinical Symptoms:  At this time she is improved compared to admission. She reports improved mood and currently presents euthymic, her affect is brighter, no thought disorder, no hallucinations, no delusions, not internally preoccupied. No suicidal or self injurious ideations. Denies medication side effects.  Behavior on unit in good control. Of note, her abdominal skin abscess was treated, drained in ED yesterday, after which patient reports feeling better, with less pain.   Cognitive Features That Contribute To Risk:  No gross cognitive deficits noted upon discharge. Is alert , attentive, and oriented x 3   Suicide Risk:  Mild:  Suicidal ideation of limited frequency, intensity, duration, and specificity.  There are no identifiable plans, no associated intent, mild dysphoria and related symptoms, good self-control (both objective and subjective assessment), few other risk factors, and identifiable protective factors, including available and accessible social support.  Follow-up Information    [facility] Follow up on 09/25/2016.   Specialty:  Behavioral Health Why:  Hospital discharge follow up appointment on 9/24 at 8 AM w Christina. Bring photo  ID and hospital discharge paperwork to this appointment.   Contact information: [address] [place] [address] [phone]        [facility]-EMERGENCY DEPT Follow up.    Specialty:  Emergency Medicine Why:  return in 2-3 days if worsening.  Pull out the packing in 2 days if still present Contact information: [address] [id number] [place] [address] [phone]          Plan Of Care/Follow-up recommendations:  Activity:  as tolerated  Diet:  Regular Tests:  NA Other:  See below  Patient is requesting discharge and expressing readiness for discharge She is leaving in good spirits  Follow up as above   Craige Cotta, MD 09/19/2016, 10:46 AM

## 2016-09-19 NOTE — Discharge Summary (Signed)
Physician Discharge Summary Note  Patient:  Melody Robinson is an 21 y.o., female MRN:  [id number] DOB:  14-Jul-1995 Patient phone:  [phone] (home)  Patient address:   [place] [address],  Total Time spent with patient: 30 minutes  Date of Admission:  09/15/2016 Date of Discharge: 09/19/2016  Reason for Admission: Per  HPI- ED Note: 21 year old female presents with suicidal thoughts and hearing voices as well as visual hallucinations. She states that this has been an on and off issue since she was about 21 years old and has been admitted multiple times. Formally this was in [place]. She states that she's been in [place] for the last several months. 2 weeks ago she talked to her mom, which usually ends up in a fight and usually setting off some of her psychiatric disease. She has now been feeling depressed, suicidal, and has had the voices and visual hallucinations. She states the voices are dark voices and she is seeing demons. She states that she has been told she has schizophrenia but has not been on any meds for about 2 years. She does not know what she used to be on. She states that she smokes but has not smoked in a few days. Drinks alcohol sparingly and does not use drugs. She states she has had some intermittent chest pain that feels like someone is squeezing her heart as well as concomitant shortness of breath. She usually states this comes with anxiety and is consistent with her prior anxiety. She will start to hyperventilate. Currently does not feel the symptoms now. No recent travel in the last 1 month or leg swelling or leg pain. States she has had multiple different thoughts of how to kill herself including taking her friend's gun and shooting herself in the head. Recently she has cut herself on her right forearm with a knife.  Assessment Note: Melody Robinson., who prefers to be called Melody Robinson, is a 21 year old female admitted to [facility] for SI and hallucinations. Patient denies intent  or plan for suicide at this time, "just thoughts." She is abel to contract for safety. Prior to admission to [facility] patient endorsed auditory and visual hallucinations. However, upon admission to [facility] patient reports just "hearing voices" that are mumbling at this time. Patient denies that they are command in nature. Patient denies homicidal ideation. She denies pain at this time. Patient's skin search reveals self-inflicted cuts on patient's bilateral forearms which appear to be fresh. Patient reports she cut right before admission into ED. Patient's thighs also have self inflicted cut scars, these appear older than those on patient's forearms. Patient also has an abcess on her mid-abdomen. This is not open at this time. Patient reports that this is not the first time that this has occurred. She reports that she moved from [place] a few months ago to get away from her family. She reports, "I'm here to see if I have Schizophrenia and check my PTSD." Patient does appear disheveled, her hair is matted at this time. She is blunted in affect and eye contact is brief. She reports that she is going through a divorce but does not elaborate on this subject. She reports that she has been hospitalized for mental health issues, "too many times to count."  Patient reports to me that she thinks she has been on Prozac and Abilify in the past, but she cannot take Abilify anymore. She states that she has been on so many medications that she doesn't remember all of  them. Her last reported hospitalization was in [place], [place] 5 years ago. While discussing medications the patient asks what effects the medications would have on her baby. She reports that she is pregnant about 2 months. The lab results indicate urine preg test is negative, patient then states that happened before and the provider told her that the urine may show up negative. Discussed with Dr. Jackquline Berlin and the decision was made to ensure by blood test whether patient is  pregnant or if it a delusional thought. She does admit AH at this time as well.  Principal Problem: Borderline personality disorder Discharge Diagnoses: Patient Active Problem List   Diagnosis Date Noted  . Abscess of abdominal wall [L02.211]   . Borderline personality disorder [F60.3] 09/16/2016  . Major depressive disorder, recurrent episode, severe, with psychosis (HCC) [F33.3] 09/15/2016  . Major depressive disorder, recurrent severe without psychotic features (HCC) [F33.2] 09/15/2016    Past Psychiatric History:   Past Medical History:  Past Medical History:  Diagnosis Date  . Depression   . Hallucinations   . Medical history non-contributory   . PTSD (post-traumatic stress disorder)     Past Surgical History:  Procedure Laterality Date  . NO PAST SURGERIES     Family History:  Family History  Problem Relation Age of Onset  . Cancer Paternal Grandmother    Family Psychiatric  History:  Social History:  History  Alcohol Use No     History  Drug Use No    Social History   Social History  . Marital status: Legally Separated    Spouse name: N/A  . Number of children: N/A  . Years of education: N/A   Social History Main Topics  . Smoking status: Current Every Day Smoker    Packs/day: 0.25  . Smokeless tobacco: Never Used  . Alcohol use No  . Drug use: No  . Sexual activity: Yes    Birth control/ protection: None   Other Topics Concern  . None   Social History Narrative  . None    Hospital Course:  HILARY Robinson was admitted for Borderline personality disorder  and crisis management.  Pt was treated discharged with the medications listed below under Medication List.  Medical problems were identified and treated as needed.  Home medications were restarted as appropriate.  Improvement was monitored by observation and Rondel Oh 's daily report of symptom reduction.  Emotional and mental status was monitored by daily self-inventory reports  completed by Rondel Oh and clinical staff.         Melody Robinson was evaluated by the treatment team for stability and plans for continued recovery upon discharge. ALLYE HOYOS 's motivation was an integral factor for scheduling further treatment. Employment, transportation, bed availability, health status, family support, and any pending legal issues were also considered during hospital stay. Pt was offered further treatment options upon discharge including but not limited to Residential, Intensive Outpatient, and Outpatient treatment.  DELORES EDELSTEIN will follow up with the services as listed below under Follow Up Information.     Upon completion of this admission the patient was both mentally and medically stable for discharge denying suicidal/homicidal ideation, auditory/visual/tactile hallucinations, delusional thoughts and paranoia.    Rondel Oh responded well to treatment with Prozac 20 mg, Zyprexa  and Trazodone  without adverse effects. Pt demonstrated improvement without reported or observed adverse effects to the point of stability appropriate for outpatient management. Pertinent labs include: CBC,  CMP  for which outpatient follow-up is necessary for lab recheck as mentioned below. Reviewed CBC, CMP, BAL, and UDS; all unremarkable aside from noted exceptions.  Physical Findings: AIMS: Facial and Oral Movements Muscles of Facial Expression: None, normal Lips and Perioral Area: None, normal Jaw: None, normal Tongue: None, normal,Extremity Movements Upper (arms, wrists, hands, fingers): None, normal Lower (legs, knees, ankles, toes): None, normal, Trunk Movements Neck, shoulders, hips: None, normal, Overall Severity Severity of abnormal movements (highest score from questions above): None, normal Incapacitation due to abnormal movements: None, normal Patient's awareness of abnormal movements (rate only patient's report): No Awareness, Dental Status Current  problems with teeth and/or dentures?: No Does patient usually wear dentures?: No  CIWA:  CIWA-Ar Total: 1 COWS:  COWS Total Score: 1  Musculoskeletal: Strength & Muscle Tone: within normal limits Gait & Station: normal Patient leans: N/A  Psychiatric Specialty Exam: See SRA by MD Physical Exam  Vitals reviewed. Constitutional: She appears well-developed.  Cardiovascular: Normal rate.   Neurological: She is alert.  Psychiatric: She has a normal mood and affect. Her behavior is normal.    Review of Systems  Psychiatric/Behavioral: Negative for depression (stable) and suicidal ideas. The patient is not nervous/anxious.     Blood pressure 119/70, pulse 80, temperature 97.6 F (36.4 C), temperature source Oral, resp. rate 20, height 6' (1.829 m), weight 104.3 kg (230 lb), SpO2 100 %.Body mass index is 31.19 kg/m.  Have you used any form of tobacco in the last 30 days? (Cigarettes, Smokeless Tobacco, Cigars, and/or Pipes): Yes  Has this patient used any form of tobacco in the last 30 days? (Cigarettes, Smokeless Tobacco, Cigars, and/or Pipes), No  Blood Alcohol level:  Lab Results  Component Value Date   ETH <5 09/14/2016    Metabolic Disorder Labs:  No results found for: HGBA1C, MPG No results found for: PROLACTIN No results found for: CHOL, TRIG, HDL, CHOLHDL, VLDL, LDLCALC  See Psychiatric Specialty Exam and Suicide Risk Assessment completed by Attending Physician prior to discharge.  Discharge destination:  Home  Is patient on multiple antipsychotic therapies at discharge:  No   Has Patient had three or more failed trials of antipsychotic monotherapy by history:  No  Recommended Plan for Multiple Antipsychotic Therapies: NA  Discharge Instructions    Diet - low sodium heart healthy    Complete by:  As directed    Discharge instructions    Complete by:  As directed    Take all medications as prescribed. Keep all follow-up appointments as scheduled.  Do not consume  alcohol or use illegal drugs while on prescription medications. Report any adverse effects from your medications to your primary care provider promptly.  In the event of recurrent symptoms or worsening symptoms, call 911, a crisis hotline, or go to the nearest emergency department for evaluation.   Increase activity slowly    Complete by:  As directed      Allergies as of 09/19/2016   No Known Allergies     Medication List    STOP taking these medications   metroNIDAZOLE 500 MG tablet Commonly known as:  FLAGYL     TAKE these medications     Indication  clindamycin 150 MG capsule Commonly known as:  CLEOCIN Take 1 capsule (150 mg total) by mouth every 6 (six) hours.    FLUoxetine 20 MG capsule Commonly known as:  PROZAC Take 1 capsule (20 mg total) by mouth daily.  Indication:  Depression   OLANZapine 5  MG tablet Commonly known as:  ZYPREXA Take 1 tablet (5 mg total) by mouth at bedtime.  Indication:  psychosis   traZODone 50 MG tablet Commonly known as:  DESYREL Take 1 tablet (50 mg total) by mouth at bedtime as needed for sleep.  Indication:  Major Depressive Disorder      Follow-up Information    [facility] Follow up on 09/25/2016.   Specialty:  Behavioral Health Why:  Hospital discharge follow up appointment on 9/24 at 8 AM w Christina. Bring photo ID and hospital discharge paperwork to this appointment.   Contact information: [address] [place] [address] [phone]        [facility] Follow up.   Specialty:  Emergency Medicine Why:  return in 2-3 days if worsening.  Pull out the packing in 2 days if still present Contact information: [address] [id number] mc [place] [address] [phone]          Follow-up recommendations:  Activity:  as tolerated  Diet:  heart healthy  Comments: Take all medications as prescribed. Keep all follow-up appointments as scheduled.  Do not consume  alcohol or use illegal drugs while on prescription medications. Report any adverse effects from your medications to your primary care provider promptly.  In the event of recurrent symptoms or worsening symptoms, call 911, a crisis hotline, or go to the nearest emergency department for evaluation.   Signed: Oneta Rack, NP 09/19/2016, 11:00 AM   Patient seen, Suicide Assessment Completed.  Disposition Plan Reviewed

## 2016-09-19 NOTE — Progress Notes (Signed)
  [facility] Adult Case Management Discharge Plan :  Will you be returning to the same living situation after discharge:  Yes,  pt returning home. At discharge, do you have transportation home?: Yes,  pt has access to transportation. Do you have the ability to pay for your medications: Yes,  prescriptions and samples provided.  Release of information consent forms completed and in the chart;  Patient's signature needed at discharge.  Patient to Follow up at: Follow-up Information    [facility] Follow up on 09/25/2016.   Specialty:  Behavioral Health Why:  Hospital discharge follow up appointment on 9/24 at 8 AM w Christina. Bring photo ID and hospital discharge paperwork to this appointment.   Contact information: [address] [place] [address] [phone]        [facility] Follow up.   Specialty:  Emergency Medicine Why:  return in 2-3 days if worsening.  Pull out the packing in 2 days if still present Contact information: [address] [id number] [address]c [place] [address] [phone]          Next level of care provider has access to [facility] Link:no  Safety Planning and Suicide Prevention discussed: Yes,  with pt.  Have you used any form of tobacco in the last 30 days? (Cigarettes, Smokeless Tobacco, Cigars, and/or Pipes): Yes  Has patient been referred to the Quitline?: Patient refused referral  Patient has been referred for addiction treatment: N/A  Jonathon Jordan, MSW, LCSWA 09/19/2016, 10:06 AM

## 2016-09-19 NOTE — Progress Notes (Signed)
Adult Psychoeducational Group Note  Date:  09/19/2016 Time:  1:20 AM  Group Topic/Focus:  Wrap-Up Group:   The focus of this group is to help patients review their daily goal of treatment and discuss progress on daily workbooks.  Participation Level:  Active  Participation Quality:  Appropriate  Affect:  Appropriate  Cognitive:  Appropriate  Insight: Appropriate  Engagement in Group:  Engaged  Modes of Intervention:  Discussion  Additional Comments:  Pt stated her goal for today was talk with doctor about her discharge plan. Pt stated she accomplished her goal today and planned to talk more with her doctor tomorrow. Pt rated her over all day a 8 out of 10. Pt stated her goal for tomorrow was to attended all groups held.  Melody Robinson 09/19/2016, 1:20 AM

## 2016-09-19 NOTE — Progress Notes (Signed)
Discharge Note:  Patient discharged home with bus tickets.  Patient denied SI and HI.  Denied A/V hallucinations.  Suicide prevention information given and discussed with patient who stated she understood and had no questions.  Patient stated she received all her information, clothing, medications, prescriptions, misc items, etc.  Patient stated she appreciated all assistance received from [facility] staff.  All required discharge information given to patient at discharge.

## 2016-09-19 NOTE — Progress Notes (Addendum)
D:  Patient's self inventory sheet, patient has good sleep, sleep medication helpful.   Good appetite, normal energy level, good concentration.  Denied depression, hopeless, anxiety.  Denied withdrawals.  Denied SI.  Denied physical problems.  Denied physical pain.  Goal is discharge.  Plans to talk to MD.  Does have discharge plans. A:  Medications administered per MD orders.  Emotional support and encouragement given patient. R:  Patient denied SI and HI, contracts for safety.  Denied A/V hallucinations.  Safety maintained with 15 minute checks.

## 2016-09-23 LAB — GLUCOSE, POCT (MANUAL RESULT ENTRY): POC Glucose: 106 mg/dl — AB (ref 70–99)

## 2016-10-26 ENCOUNTER — Emergency Department (HOSPITAL_COMMUNITY): Payer: Self-pay

## 2016-10-26 ENCOUNTER — Emergency Department (HOSPITAL_COMMUNITY)
Admission: EM | Admit: 2016-10-26 | Discharge: 2016-10-26 | Disposition: A | Discharge status: Home / Self Care | Payer: Self-pay | Attending: Emergency Medicine | Admitting: Emergency Medicine

## 2016-10-26 ENCOUNTER — Encounter (HOSPITAL_COMMUNITY): Payer: Self-pay

## 2016-10-26 DIAGNOSIS — Z79899 Other long term (current) drug therapy: Secondary | ICD-10-CM | POA: Insufficient documentation

## 2016-10-26 DIAGNOSIS — M940 Chondrocostal junction syndrome [Tietze]: Secondary | ICD-10-CM | POA: Insufficient documentation

## 2016-10-26 DIAGNOSIS — R071 Chest pain on breathing: Secondary | ICD-10-CM

## 2016-10-26 DIAGNOSIS — F17210 Nicotine dependence, cigarettes, uncomplicated: Secondary | ICD-10-CM | POA: Insufficient documentation

## 2016-10-26 DIAGNOSIS — R0789 Other chest pain: Secondary | ICD-10-CM

## 2016-10-26 LAB — URINALYSIS, ROUTINE W REFLEX MICROSCOPIC
BILIRUBIN URINE: NEGATIVE
Glucose, UA: NEGATIVE mg/dL
HGB URINE DIPSTICK: NEGATIVE
Ketones, ur: NEGATIVE mg/dL
Leukocytes, UA: NEGATIVE
NITRITE: NEGATIVE
PROTEIN: NEGATIVE mg/dL
Specific Gravity, Urine: 1.024 (ref 1.005–1.030)
pH: 5 (ref 5.0–8.0)

## 2016-10-26 LAB — POC URINE PREG, ED: PREG TEST UR: NEGATIVE

## 2016-10-26 LAB — CBC
HCT: 32.6 % — ABNORMAL LOW (ref 36.0–46.0)
Hemoglobin: 10.4 g/dL — ABNORMAL LOW (ref 12.0–15.0)
MCH: 25.6 pg — AB (ref 26.0–34.0)
MCHC: 31.9 g/dL (ref 30.0–36.0)
MCV: 80.3 fL (ref 78.0–100.0)
PLATELETS: 267 10*3/uL (ref 150–400)
RBC: 4.06 MIL/uL (ref 3.87–5.11)
RDW: 17.2 % — ABNORMAL HIGH (ref 11.5–15.5)
WBC: 6.1 10*3/uL (ref 4.0–10.5)

## 2016-10-26 LAB — BASIC METABOLIC PANEL
ANION GAP: 7 (ref 5–15)
BUN: 9 mg/dL (ref 6–20)
CALCIUM: 9.4 mg/dL (ref 8.9–10.3)
CO2: 24 mmol/L (ref 22–32)
CREATININE: 0.62 mg/dL (ref 0.44–1.00)
Chloride: 107 mmol/L (ref 101–111)
GLUCOSE: 87 mg/dL (ref 65–99)
Potassium: 3.6 mmol/L (ref 3.5–5.1)
Sodium: 138 mmol/L (ref 135–145)

## 2016-10-26 LAB — POCT I-STAT TROPONIN I: TROPONIN I, POC: 0 ng/mL (ref 0.00–0.08)

## 2016-10-26 MED ORDER — IBUPROFEN 600 MG PO TABS: 600.0000 mg | ORAL_TABLET | Freq: Four times a day (QID) | ORAL | 0 refills | Status: DC | PRN

## 2016-10-26 NOTE — ED Provider Notes (Signed)
[facility] Provider Note   CSN: [id number] Arrival date & time: 10/26/16  1145     History   Chief Complaint Chief Complaint  Patient presents with  . Chest Pain  . Shortness of Breath  . Urinary Frequency    HPI Melody Robinson is a 21 y.o. female with a history of depression, hallucinations, PTSD who presents emerged department today for 3-day history of chest pain shortness of breath.  This is a new issue. The patient states that over the last 3 days she has had a gradual onset of mid upper chest pain that she describes as a pressure with associated shortness of breath. Denies DOE, radiation to left arm, jaw or back, nausea, or diaphoresis. There is no pleuritic component to the pain. The pain has been constant and rates as a 4/10. She has not tried anything for this. She says that her pain improves with lying down. The patient has never had a cardiac workup in the past. No FHx of cardiac disease. Patient is a current smoker with 3 pack year history. Denies risk factors for DVT/PE including exogenous estrogen use, recent surgery or travel, trauma, immobilization, previous blood clot, hemoptysis, cancer, lower extremity pain or swelling, or family history of bleeding/clotting disorder.  The patient also complains of urinary frequency at night.  Sitting prior to 1 week ago she pointed be able to sleep through the night, however is now waking up 1-2 times per night in order to urinate.  No increase in urinary frequency throughout the day, dysuria, hematuria, urgency, abdominal pain, or flank pain.  Patient states that she also thinks she may be pregnant as she has had nausea every morning for the last 1 month.  Her last menstrual period was 1 month ago.  She denies any fever, chills, vaginal pain, vaginal discharge, vaginal bleeding.  HPI  Past Medical History:  Diagnosis Date  . Depression   . Hallucinations   . Medical history non-contributory   .  PTSD (post-traumatic stress disorder)     Patient Active Problem List   Diagnosis Date Noted  . Abscess of abdominal wall   . Borderline personality disorder (HCC) 09/16/2016  . Major depressive disorder, recurrent episode, severe, with psychosis (HCC) 09/15/2016  . Major depressive disorder, recurrent severe without psychotic features (HCC) 09/15/2016    Past Surgical History:  Procedure Laterality Date  . NO PAST SURGERIES      OB History    No data available       Home Medications    Prior to Admission medications   Medication Sig Start Date End Date Taking? Authorizing Provider  clindamycin (CLEOCIN) 150 MG capsule Take 1 capsule (150 mg total) by mouth every 6 (six) hours. 09/18/16   Gwyneth SproutPlunkett, Whitney, MD  FLUoxetine (PROZAC) 20 MG capsule Take 1 capsule (20 mg total) by mouth daily. 09/20/16   Oneta RackLewis, Tanika N, NP  OLANZapine (ZYPREXA) 5 MG tablet Take 1 tablet (5 mg total) by mouth at bedtime. 09/19/16   Oneta RackLewis, Tanika N, NP  traZODone (DESYREL) 50 MG tablet Take 1 tablet (50 mg total) by mouth at bedtime as needed for sleep. 09/19/16   Oneta RackLewis, Tanika N, NP    Family History Family History  Problem Relation Age of Onset  . Cancer Paternal Grandmother     Social History Social History  Substance Use Topics  . Smoking status: Current Every Day Smoker    Packs/day: 0.25    Types: Cigarettes  .  Smokeless tobacco: Never Used  . Alcohol use No     Allergies   Patient has no known allergies.   Review of Systems Review of Systems  All other systems reviewed and are negative.    Physical Exam Updated Vital Signs BP (!) 132/99   Pulse 66   Temp 98 F (36.7 C) (Oral)   Resp 19   Ht 6' (1.829 m)   Wt 99.8 kg (220 lb)   LMP 09/26/2016 (Approximate)   SpO2 99%   BMI 29.84 kg/m   Physical Exam  Constitutional: She appears well-developed and well-nourished.  Sitting comfortably. Non-toxic appearing. No acute distress.   HENT:  Head: Normocephalic and  atraumatic.  Right Ear: External ear normal.  Left Ear: External ear normal.  Nose: Nose normal.  Mouth/Throat: Uvula is midline, oropharynx is clear and moist and mucous membranes are normal. No tonsillar exudate.  Eyes: Pupils are equal, round, and reactive to light. Right eye exhibits no discharge. Left eye exhibits no discharge. No scleral icterus.  Neck: Trachea normal. Neck supple. No spinous process tenderness present. No neck rigidity. Normal range of motion present.  Cardiovascular: Normal rate, regular rhythm and intact distal pulses.   No murmur heard. Pulses:      Radial pulses are 2+ on the right side, and 2+ on the left side.       Dorsalis pedis pulses are 2+ on the right side, and 2+ on the left side.       Posterior tibial pulses are 2+ on the right side, and 2+ on the left side.  No lower extremity swelling or edema. Calves symmetric in size bilaterally.  Pulmonary/Chest: Effort normal and breath sounds normal. She exhibits tenderness (central chest).  Abdominal: Soft. Bowel sounds are normal. There is no tenderness. There is no rebound and no guarding.  Musculoskeletal: She exhibits no edema.  Lymphadenopathy:    She has no cervical adenopathy.  Neurological: She is alert.  Speech clear. Follows commands. No facial droop. PERRLA. EOM grossly intact. CN III-XII grossly intact. Grossly moves all extremities 4 without ataxia. Able and appropriate strength for age to upper and lower extremities bilaterally including grip strength.   Skin: Skin is warm and dry. No rash noted. She is not diaphoretic.  Psychiatric: She has a normal mood and affect.  Nursing note and vitals reviewed.    ED Treatments / Results  Labs (all labs ordered are listed, but only abnormal results are displayed) Labs Reviewed  CBC - Abnormal; Notable for the following:       Result Value   Hemoglobin 10.4 (*)    HCT 32.6 (*)    MCH 25.6 (*)    RDW 17.2 (*)    All other components within normal  limits  BASIC METABOLIC PANEL  URINALYSIS, ROUTINE W REFLEX MICROSCOPIC  I-STAT TROPONIN, ED  POC URINE PREG, ED  POCT I-STAT TROPONIN I    EKG  EKG Interpretation None       Radiology Dg Chest 2 View  Result Date: 10/26/2016 CLINICAL DATA:  Chest pain and dyspnea EXAM: CHEST  2 VIEW COMPARISON:  09/14/2016 chest radiograph. FINDINGS: Stable cardiomediastinal silhouette with normal heart size. No pneumothorax. No pleural effusion. Lungs appear clear, with no acute consolidative airspace disease and no pulmonary edema. IMPRESSION: No active cardiopulmonary disease. Electronically Signed   By: Delbert Phenix M.D.   On: 10/26/2016 13:23    Procedures Procedures (including critical care time)  Medications Ordered in ED Medications -  No data to display   Initial Impression / Assessment and Plan / ED Course  I have reviewed the triage vital signs and the nursing notes.  Pertinent labs & imaging results that were available during my care of the patient were reviewed by me and considered in my medical decision making (see chart for details).     Patient with chest pain that is palpable on on exam. Likely to be costochondritis/MSK. Patient is to be discharged with recommendation to follow up with PCP in regards to today's hospital visit. Chest pain is not likely of cardiac or pulmonary etiology d/t presentation, perc negative, VSS, no tracheal deviation, no JVD or new murmur, RRR, breath sounds equal bilaterally, EKG without acute abnormalities, negative troponin, and negative CXR. Heart score 1. Patient UA and urine pregnancy unremarkable. No abdominal TTP. CBC and CMP reassuring. Pt has been advised start a PPI and return to the ED is CP becomes exertional, associated with diaphoresis or nausea, radiates to left jaw/arm, worsens or becomes concerning in any way. Pt appears reliable for follow up and is agreeable to discharge.   Discussed smoking cessation with patient and was they were  offerred resources to help stop.  Total time was 5 min CPT code [address].   Final Clinical Impressions(s) / ED Diagnoses   Final diagnoses:  Costochondral chest pain    New Prescriptions New Prescriptions   No medications on file     Princella Pellegrini 10/26/16 1551    Benjiman Core, MD 10/26/16 [phone]

## 2016-10-26 NOTE — Discharge Instructions (Signed)
Read instructions below for reasons to return to the Emergency Department. It is recommended that your follow up with your Primary Care Doctor in regards to today's visit. If you do not have a doctor, use the resource guide listed below to help you find one.  Begin taking over the counter Prilosec or Zegrid (omeprazole) as directed.   Tests performed today include: 1. An EKG of your heart 2. A chest x-ray 3. Cardiac enzymes - a blood test for heart muscle damage 4. Blood counts and electrolytes 5. Vital signs. See below for your results today. 6. Urine - Does not show signs of infection 7. Urine preg - negative   Chest Pain (Nonspecific)  HOME CARE INSTRUCTIONS  For the next few days, avoid physical activities that bring on chest pain. Continue physical activities as directed.  Do not smoke cigarettes or drink alcohol until your symptoms are gone. If you do smoke, it is time to quit. You may receive instructions and counseling on how to stop smoking. Only take over-the-counter or prescription medicine for pain, discomfort, or fever as directed by your caregiver.  Follow your caregiver's suggestions for further testing if your chest pain does not go away.  Keep any follow-up appointments you made. If you do not go to an appointment, you could develop lasting (chronic) problems with pain. If there is any problem keeping an appointment, you must call to reschedule.  SEEK MEDICAL CARE IF:  You think you are having problems from the medicine you are taking. Read your medicine instructions carefully.  Your chest pain does not go away, even after treatment.  You develop a rash with blisters on your chest.  SEEK IMMEDIATE MEDICAL CARE IF:  You have increased chest pain or pain that spreads to your arm, neck, jaw, back, or belly (abdomen).  You develop shortness of breath, an increasing cough, or you are coughing up blood.  You have severe back or abdominal pain, feel sick to your stomach (nauseous)  or throw up (vomit).  You develop severe weakness, fainting, or chills.  You have an oral temperature above 102 F (38.9 C), not controlled by medicine.  THIS IS AN EMERGENCY. Do not wait to see if the pain will go away. Get medical help at once. Call your local emergency services (911 in [place] Do not drive yourself to the hospital. Additional Information:  Your vital signs today were: BP (!) 132/99    Pulse 66    Temp 98 F (36.7 C) (Oral)    Resp 18    Ht 6' (1.829 m)    Wt 99.8 kg (220 lb)    LMP 09/26/2016 (Approximate)    SpO2 99%    BMI 29.84 kg/m  If your blood pressure (BP) was elevated above 135/85 this visit, please have this repeated by your doctor within one month. ---------------  STOP SMOKING! We strongly recommend that you stop smoking.  Smoking increases the risk of surgery including infection in the form of an open wound, pus formation, abscess, hernia at an incision on the abdomen, etc.  You have an increased risk of other MAJOR complications such as stroke, heart attack, forming clots in the leg and/or lungs, and death.    Smoking Cessation Quitting smoking is important to your health and has many advantages. However, it is not always easy to quit since nicotine is a very addictive drug. Often times, people try 3 times or more before being able to quit. This document explains the best ways  for you to prepare to quit smoking. Quitting takes hard work and a lot of effort, but you can do it. ADVANTAGES OF QUITTING SMOKING  You will live longer, feel better, and live better.  Your body will feel the impact of quitting smoking almost immediately.  Within 20 minutes, blood pressure decreases. Your pulse returns to its normal level.  After 8 hours, carbon monoxide levels in the blood return to normal. Your oxygen level increases.  After 24 hours, the chance of having a heart attack starts to decrease. Your breath, hair, and body stop smelling like smoke.  After 48 hours,  damaged nerve endings begin to recover. Your sense of taste and smell improve.  After 72 hours, the body is virtually free of nicotine. Your bronchial tubes relax and breathing becomes easier.  After 2 to 12 weeks, lungs can hold more air. Exercise becomes easier and circulation improves.  The risk of having a heart attack, stroke, cancer, or lung disease is greatly reduced.  After 1 year, the risk of coronary heart disease is cut in half.  After 5 years, the risk of stroke falls to the same as a nonsmoker.  After 10 years, the risk of lung cancer is cut in half and the risk of other cancers decreases significantly.  After 15 years, the risk of coronary heart disease drops, usually to the level of a nonsmoker.  If you are pregnant, quitting smoking will improve your chances of having a healthy baby.  The people you live with, especially any children, will be healthier.  You will have extra money to spend on things other than cigarettes. QUESTIONS TO THINK ABOUT BEFORE ATTEMPTING TO QUIT  You may want to talk about your answers with your caregiver.  Why do you want to quit?  If you tried to quit in the past, what helped and what did not?  What will be the most difficult situations for you after you quit? How will you plan to handle them?  Who can help you through the tough times? Your family? Friends? A caregiver?  What pleasures do you get from smoking? What ways can you still get pleasure if you quit? Here are some questions to ask your caregiver:  How can you help me to be successful at quitting?  What medicine do you think would be best for me and how should I take it?  What should I do if I need more help?  What is smoking withdrawal like? How can I get information on withdrawal? GET READY  Set a quit date.  Change your environment by getting rid of all cigarettes, ashtrays, matches, and lighters in your home, car, or work. Do not let people smoke in your  home.  Review your past attempts to quit. Think about what worked and what did not. GET SUPPORT AND ENCOURAGEMENT  You have a better chance of being successful if you have help. You can get support in many ways.  Tell your family, friends, and co-workers that you are going to quit and need their support. Ask them not to smoke around you.  Get individual, group, or telephone counseling and support. Programs are available at [employer] and health centers. Call your local health department for information about programs in your area.  Spiritual beliefs and practices may help some smokers quit.  Download a "quit meter" on your computer to keep track of quit statistics, such as how long you have gone without smoking, cigarettes not smoked, and money  saved.  Get a self-help book about quitting smoking and staying off of tobacco. LEARN NEW SKILLS AND BEHAVIORS  Distract yourself from urges to smoke. Talk to someone, go for a walk, or occupy your time with a task.  Change your normal routine. Take a different route to work. Drink tea instead of coffee. Eat breakfast in a different place.  Reduce your stress. Take a hot bath, exercise, or read a book.  Plan something enjoyable to do every day. Reward yourself for not smoking.  Explore interactive web-based programs that specialize in helping you quit. GET MEDICINE AND USE IT CORRECTLY  Medicines can help you stop smoking and decrease the urge to smoke. Combining medicine with the above behavioral methods and support can greatly increase your chances of successfully quitting smoking.  Nicotine replacement therapy helps deliver nicotine to your body without the negative effects and risks of smoking. Nicotine replacement therapy includes nicotine gum, lozenges, inhalers, nasal sprays, and skin patches. Some may be available over-the-counter and others require a prescription.  Antidepressant medicine helps people abstain from smoking, but  how this works is unknown. This medicine is available by prescription.  Nicotinic receptor partial agonist medicine simulates the effect of nicotine in your brain. This medicine is available by prescription.  Ask your caregiver for advice about which medicines to use and how to use them based on your health history. Your caregiver will tell you what side effects to look out for if you choose to be on a medicine or therapy. Carefully read the information on the package. Do not use any other product containing nicotine while using a nicotine replacement product.  RELAPSE OR DIFFICULT SITUATIONS Most relapses occur within the first 3 months after quitting. Do not be discouraged if you start smoking again. Remember, most people try several times before finally quitting. You may have symptoms of withdrawal because your body is used to nicotine. You may crave cigarettes, be irritable, feel very hungry, cough often, get headaches, or have difficulty concentrating. The withdrawal symptoms are only temporary. They are strongest when you first quit, but they will go away within 10 14 days. To reduce the chances of relapse, try to:  Avoid drinking alcohol. Drinking lowers your chances of successfully quitting.  Reduce the amount of caffeine you consume. Once you quit smoking, the amount of caffeine in your body increases and can give you symptoms, such as a rapid heartbeat, sweating, and anxiety.  Avoid smokers because they can make you want to smoke.  Do not let weight gain distract you. Many smokers will gain weight when they quit, usually less than 10 pounds. Eat a healthy diet and stay active. You can always lose the weight gained after you quit.  Find ways to improve your mood other than smoking. FOR MORE INFORMATION  www.smokefree.gov    While it can be one of the most difficult things to do, the [place] community has programs to help you stop.  Consider talking with your primary care physician about  options.  Also, Smoking Cessation classes are available through the [facility]:  The smoking cessation program is a proven-effective program from the American Lung Association. The program is available for anyone 65 and older who currently smokes. The program lasts for 7 weeks and is 8 sessions. Each class will be approximately 1 1/2 hours. The program is every Tuesday.  All classes are 12-1:30pm and same location.  Event Location Information:  Location: [facility] 2nd Floor Conference Room  2-037; located next to [facility] cross streets: [facility] [address] [facility] Entrance into the [facility] is adjacent to the [facility] main entrance. The conference room is located on the 2nd floor.  Parking Instructions: Visitor parking is adjacent to [employer] main entrance and the Cancer Center  A smoking cessation program is also offered through the [facility]. Register online at [email or web address] or call [phone] for more information.   Tobacco cessation counseling is available at [facility]. Call [phone] for a free appointment.   Tobacco cessation classes also are available through the [facility] in [place]. For information, call [phone].   The Patient Education Network features videos on tobacco cessation. Please consult your listings in the center of this book to find instructions on how to access this resource.   If you want more information, ask your nurse.

## 2016-10-26 NOTE — ED Triage Notes (Signed)
Patient c/o mid upper chest pain and SOB x 3 days. Patient thinks she might be pregnant. Patient states she has N/V every morning x 1 month and urinary frequency.

## 2016-11-14 ENCOUNTER — Emergency Department (HOSPITAL_COMMUNITY): Payer: Self-pay

## 2016-11-14 ENCOUNTER — Emergency Department (HOSPITAL_COMMUNITY)
Admission: EM | Admit: 2016-11-14 | Discharge: 2016-11-15 | Disposition: A | Discharge status: Home / Self Care | Payer: Self-pay | Attending: Emergency Medicine | Admitting: Emergency Medicine

## 2016-11-14 ENCOUNTER — Encounter (HOSPITAL_COMMUNITY): Payer: Self-pay

## 2016-11-14 ENCOUNTER — Other Ambulatory Visit: Payer: Self-pay

## 2016-11-14 DIAGNOSIS — F419 Anxiety disorder, unspecified: Secondary | ICD-10-CM | POA: Insufficient documentation

## 2016-11-14 DIAGNOSIS — Z79899 Other long term (current) drug therapy: Secondary | ICD-10-CM | POA: Insufficient documentation

## 2016-11-14 DIAGNOSIS — R079 Chest pain, unspecified: Secondary | ICD-10-CM

## 2016-11-14 DIAGNOSIS — F603 Borderline personality disorder: Secondary | ICD-10-CM | POA: Insufficient documentation

## 2016-11-14 DIAGNOSIS — F329 Major depressive disorder, single episode, unspecified: Secondary | ICD-10-CM | POA: Insufficient documentation

## 2016-11-14 DIAGNOSIS — F17210 Nicotine dependence, cigarettes, uncomplicated: Secondary | ICD-10-CM | POA: Insufficient documentation

## 2016-11-14 LAB — BASIC METABOLIC PANEL
Anion gap: 6 (ref 5–15)
BUN: 7 mg/dL (ref 6–20)
CALCIUM: 9.1 mg/dL (ref 8.9–10.3)
CHLORIDE: 107 mmol/L (ref 101–111)
CO2: 22 mmol/L (ref 22–32)
CREATININE: 0.59 mg/dL (ref 0.44–1.00)
Glucose, Bld: 80 mg/dL (ref 65–99)
Potassium: 3.4 mmol/L — ABNORMAL LOW (ref 3.5–5.1)
SODIUM: 135 mmol/L (ref 135–145)

## 2016-11-14 LAB — CBC WITH DIFFERENTIAL/PLATELET
BASOS ABS: 0.1 10*3/uL (ref 0.0–0.1)
BASOS PCT: 1 %
EOS PCT: 2 %
Eosinophils Absolute: 0.1 10*3/uL (ref 0.0–0.7)
HEMATOCRIT: 32.6 % — AB (ref 36.0–46.0)
Hemoglobin: 10.4 g/dL — ABNORMAL LOW (ref 12.0–15.0)
Lymphocytes Relative: 32 %
Lymphs Abs: 2.1 10*3/uL (ref 0.7–4.0)
MCH: 25.4 pg — ABNORMAL LOW (ref 26.0–34.0)
MCHC: 31.9 g/dL (ref 30.0–36.0)
MCV: 79.7 fL (ref 78.0–100.0)
MONO ABS: 0.5 10*3/uL (ref 0.1–1.0)
Monocytes Relative: 7 %
NEUTROS ABS: 3.9 10*3/uL (ref 1.7–7.7)
Neutrophils Relative %: 58 %
PLATELETS: 259 10*3/uL (ref 150–400)
RBC: 4.09 MIL/uL (ref 3.87–5.11)
RDW: 16.5 % — AB (ref 11.5–15.5)
WBC: 6.6 10*3/uL (ref 4.0–10.5)

## 2016-11-14 LAB — I-STAT BETA HCG BLOOD, ED (MC, WL, AP ONLY)

## 2016-11-14 LAB — I-STAT TROPONIN, ED: TROPONIN I, POC: 0 ng/mL (ref 0.00–0.08)

## 2016-11-14 NOTE — ED Provider Notes (Signed)
[facility] EMERGENCY [facility]PARTMENT Provider Note   CSN: [id number] Arrival date & time: 11/14/16  2134     History   Chief Complaint Chief Complaint  Patient presents with  . Chest Pain    HPI Melody Robinson is a 21 y.o. female.  Patient presents to the emergency department with a chief complaint of chest pain.  She states that the symptoms started around 8 PM tonight.  She states that she feels a tightness in her chest.  She states that she has been under a lot of stress, and states that this feels similar to previous stressful times in her life.  She denies any shortness of breath.  She reports that she has had some nausea and vomiting, especially in the mornings.  She denies any abdominal pain.  She has not taken anything for her symptoms.   The history is provided by the patient. No language interpreter was used.    Past Medical History:  Diagnosis Date  . Depression   . Hallucinations   . Medical history non-contributory   . PTSD (post-traumatic stress disorder)     Patient Active Problem List   Diagnosis Date Noted  . Abscess of abdominal wall   . Borderline personality disorder (HCC) 09/16/2016  . Major depressive disorder, recurrent episode, severe, with psychosis (HCC) 09/15/2016  . Major depressive disorder, recurrent severe without psychotic features (HCC) 09/15/2016    Past Surgical History:  Procedure Laterality Date  . NO PAST SURGERIES      OB History    No data available       Home Medications    Prior to Admission medications   Medication Sig Start Date End Date Taking? Authorizing Provider  FLUoxetine (PROZAC) 20 MG capsule Take 1 capsule (20 mg total) by mouth daily. 09/20/16  Yes Oneta RackLewis, Tanika N, NP  ibuprofen (ADVIL,MOTRIN) 600 MG tablet Take 1 tablet (600 mg total) by mouth every 6 (six) hours as needed. 10/26/16  Yes Maczis, Elmer SowMichael M, PA-C  OLANZapine (ZYPREXA) 5 MG tablet Take 1 tablet (5 mg total) by mouth at  bedtime. 09/19/16  Yes Oneta RackLewis, Tanika N, NP  traZODone (DESYREL) 50 MG tablet Take 1 tablet (50 mg total) by mouth at bedtime as needed for sleep. 09/19/16  Yes Oneta RackLewis, Tanika N, NP  clindamycin (CLEOCIN) 150 MG capsule Take 1 capsule (150 mg total) by mouth every 6 (six) hours. Patient not taking: Reported on 11/14/2016 09/18/16   Gwyneth SproutPlunkett, Whitney, MD    Family History Family History  Problem Relation Age of Onset  . Cancer Paternal Grandmother     Social History Social History   Tobacco Use  . Smoking status: Current Every Day Smoker    Packs/day: 0.25    Types: Cigarettes  . Smokeless tobacco: Never Used  Substance Use Topics  . Alcohol use: No  . Drug use: No     Allergies   Patient has no known allergies.   Review of Systems Review of Systems  All other systems reviewed and are negative.    Physical Exam Updated Vital Signs LMP 10/25/2016   Physical Exam  Constitutional: She is oriented to person, place, and time. She appears well-developed and well-nourished.  HENT:  Head: Normocephalic and atraumatic.  Eyes: Conjunctivae and EOM are normal. Pupils are equal, round, and reactive to light.  Neck: Normal range of motion. Neck supple.  Cardiovascular: Normal rate and regular rhythm. Exam reveals no gallop and no friction rub.  No murmur heard.  Pulmonary/Chest: Effort normal and breath sounds normal. No respiratory distress. She has no wheezes. She has no rales. She exhibits no tenderness.  Abdominal: Soft. Bowel sounds are normal. She exhibits no distension and no mass. There is no tenderness. There is no rebound and no guarding.  Musculoskeletal: Normal range of motion. She exhibits no edema or tenderness.  Neurological: She is alert and oriented to person, place, and time.  Skin: Skin is warm and dry.  Psychiatric: She has a normal mood and affect. Her behavior is normal. Judgment and thought content normal.  Nursing note and vitals reviewed.    ED  Treatments / Results  Labs (all labs ordered are listed, but only abnormal results are displayed) Labs Reviewed  CBC WITH DIFFERENTIAL/PLATELET  BASIC METABOLIC PANEL  I-STAT BETA HCG BLOOD, ED ([facility], [facility], [facility] ONLY)    EKG  EKG Interpretation None       Radiology No results found.  Procedures Procedures (including critical care time)  Medications Ordered in ED Medications - No data to display   Initial Impression / Assessment and Plan / ED Course  I have reviewed the triage vital signs and the nursing notes.  Pertinent labs & imaging results that were available during my care of the patient were reviewed by me and considered in my medical decision making (see chart for details).     Patient with chest tightness since about 8 PM this morning.  She also had some nausea and vomiting in the mornings.  She states that she has had similar some weakness in the past, and reports that they have come during stressful times in her life.  Chest x-ray is negative.  EKG shows no ischemic changes or arrhythmias.  Troponin negative.  HCG is negative.  Patient is low risk for ACS or PE.  She is not tachycardic nor hypoxic.  She denies any recent travel or immobilization.  Discharged home with PCP follow-up.  Final Clinical Impressions(s) / ED Diagnoses   Final diagnoses:  Anxiety  Nonspecific chest pain    ED Discharge Orders    None       Roxy HorsemanBrowning, Akira Perusse, PA-C 11/15/16 0111    Nira Connardama, Pedro Eduardo, MD 11/15/16 2110

## 2016-11-14 NOTE — ED Triage Notes (Signed)
[place] [address] via EMS with complaints of chest pain since 2000 this evening. Pt states [place] the mall [place] when her chest began to burn like it did previously this week but worse. [facility] and was told "I have inflammation around my lungs causing CP, maybe it was around my heart too, I dont remember. They told me to try and lower my anxiety but didn't give me anything for me". Pt placed in position of comfort with bed locked and lowered, call bell in reach.

## 2016-11-14 NOTE — ED Notes (Signed)
Pt to xray

## 2016-12-29 ENCOUNTER — Encounter (HOSPITAL_COMMUNITY): Payer: Self-pay | Admitting: Emergency Medicine

## 2016-12-29 ENCOUNTER — Emergency Department (HOSPITAL_COMMUNITY)
Admission: EM | Admit: 2016-12-29 | Discharge: 2016-12-30 | Disposition: A | Discharge status: Home / Self Care | Payer: Self-pay | Attending: Emergency Medicine | Admitting: Emergency Medicine

## 2016-12-29 DIAGNOSIS — F332 Major depressive disorder, recurrent severe without psychotic features: Secondary | ICD-10-CM | POA: Insufficient documentation

## 2016-12-29 DIAGNOSIS — F4310 Post-traumatic stress disorder, unspecified: Secondary | ICD-10-CM | POA: Insufficient documentation

## 2016-12-29 DIAGNOSIS — F17210 Nicotine dependence, cigarettes, uncomplicated: Secondary | ICD-10-CM | POA: Insufficient documentation

## 2016-12-29 DIAGNOSIS — F4325 Adjustment disorder with mixed disturbance of emotions and conduct: Secondary | ICD-10-CM | POA: Insufficient documentation

## 2016-12-29 DIAGNOSIS — R45851 Suicidal ideations: Secondary | ICD-10-CM | POA: Insufficient documentation

## 2016-12-29 DIAGNOSIS — F321 Major depressive disorder, single episode, moderate: Secondary | ICD-10-CM

## 2016-12-29 LAB — COMPREHENSIVE METABOLIC PANEL
ALT: 10 U/L — AB (ref 14–54)
ANION GAP: 8 (ref 5–15)
AST: 20 U/L (ref 15–41)
Albumin: 4.1 g/dL (ref 3.5–5.0)
Alkaline Phosphatase: 57 U/L (ref 38–126)
BUN: 11 mg/dL (ref 6–20)
CHLORIDE: 106 mmol/L (ref 101–111)
CO2: 24 mmol/L (ref 22–32)
CREATININE: 0.55 mg/dL (ref 0.44–1.00)
Calcium: 9.1 mg/dL (ref 8.9–10.3)
GFR calc non Af Amer: >60 mL/min (ref >60)
Glucose, Bld: 103 mg/dL — ABNORMAL HIGH (ref 65–99)
POTASSIUM: 3.7 mmol/L (ref 3.5–5.1)
SODIUM: 138 mmol/L (ref 135–145)
Total Bilirubin: 0.4 mg/dL (ref 0.3–1.2)
Total Protein: 8 g/dL (ref 6.5–8.1)

## 2016-12-29 LAB — CBC
HCT: 35.3 % — ABNORMAL LOW (ref 36.0–46.0)
HEMOGLOBIN: 11.4 g/dL — AB (ref 12.0–15.0)
MCH: 26 pg (ref 26.0–34.0)
MCHC: 32.3 g/dL (ref 30.0–36.0)
MCV: 80.6 fL (ref 78.0–100.0)
PLATELETS: 272 10*3/uL (ref 150–400)
RBC: 4.38 MIL/uL (ref 3.87–5.11)
RDW: 15.5 % (ref 11.5–15.5)
WBC: 7.2 10*3/uL (ref 4.0–10.5)

## 2016-12-29 LAB — RAPID URINE DRUG SCREEN, HOSP PERFORMED
AMPHETAMINES: NOT DETECTED
BENZODIAZEPINES: NOT DETECTED
Barbiturates: NOT DETECTED
COCAINE: NOT DETECTED
OPIATES: NOT DETECTED
TETRAHYDROCANNABINOL: POSITIVE — AB

## 2016-12-29 LAB — ACETAMINOPHEN LEVEL

## 2016-12-29 LAB — I-STAT BETA HCG BLOOD, ED (MC, WL, AP ONLY): I-stat hCG, quantitative: <5 m[IU]/mL (ref <5)

## 2016-12-29 LAB — ETHANOL: Alcohol, Ethyl (B): <10 mg/dL (ref <10)

## 2016-12-29 LAB — SALICYLATE LEVEL

## 2016-12-29 MED ORDER — LORAZEPAM 2 MG/ML IJ SOLN: 2.0000 mg | Freq: Once | INTRAMUSCULAR | Status: DC | PRN

## 2016-12-29 MED ORDER — TRAZODONE HCL 50 MG PO TABS
50.0000 mg | ORAL_TABLET | Freq: Every evening | ORAL | Status: DC | PRN
  Administered 2016-12-29: 50 mg via ORAL
  Filled 2016-12-29: qty 1

## 2016-12-29 MED ORDER — OLANZAPINE 5 MG PO TABS
5.0000 mg | ORAL_TABLET | Freq: Every day | ORAL | Status: DC
  Administered 2016-12-29: 5 mg via ORAL
  Filled 2016-12-29: qty 1

## 2016-12-29 MED ORDER — ZIPRASIDONE MESYLATE 20 MG IM SOLR: 20.0000 mg | Freq: Once | INTRAMUSCULAR | Status: DC | PRN

## 2016-12-29 MED ORDER — FLUOXETINE HCL 20 MG PO CAPS
20.0000 mg | ORAL_CAPSULE | Freq: Every day | ORAL | Status: DC
  Administered 2016-12-29 – 2016-12-30 (×2): 20 mg via ORAL
  Filled 2016-12-29 (×2): qty 1

## 2016-12-29 MED ORDER — ACETAMINOPHEN 325 MG PO TABS
650.0000 mg | ORAL_TABLET | Freq: Four times a day (QID) | ORAL | Status: DC | PRN
  Administered 2016-12-29: 650 mg via ORAL
  Filled 2016-12-29: qty 2

## 2016-12-29 MED ORDER — DIPHENHYDRAMINE HCL 50 MG/ML IJ SOLN: 50.0000 mg | Freq: Once | INTRAMUSCULAR | Status: DC | PRN
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
## 2016-12-29 NOTE — ED Notes (Signed)
Nurse called into patient's room where she began to cry saying she wanted to go home and she was told in triage that she would be able to go home after several hours.  She also related how she would begin to throw up and not be able to sleep and her condition would soon worsen if she was not allowed to leave.  Nurse asked patient if she would like some medication to help her rest and she replied, "no I would just throw it up."  When asked if she would like it in shot form she said no.  Nurse explained to patient that she was IVC'd and would not be able to leave until the doctor assessed her tomorrow.  Patient accepted this and her behavior did not escalate.  She is currently resting in bed.

## 2016-12-29 NOTE — ED Notes (Signed)
Bed: [facility] Expected date:  Expected time:  Means of arrival:  Comments: Triage 3 

## 2016-12-29 NOTE — ED Notes (Signed)
Delay in lab work per [employer] RN, Patient is uncooperative at this time. Will follow up.
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
## 2016-12-29 NOTE — ED Notes (Signed)
Bed: [facility] Expected date:  Expected time:  Means of arrival:  Comments: 

## 2016-12-29 NOTE — ED Notes (Signed)
SBAR Report received from previous nurse. Pt received calm and visible on unit. Pt denies current SI/ HI, A/V H, depression, anxiety,and rates  pain  4/10 at this time, and appears otherwise stable and free of distress. Pt reminded of camera surveillance, q 15 min rounds, and rules of the milieu. Will continue to assess. 

## 2016-12-29 NOTE — ED Notes (Signed)
TTS at bedside. 
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
## 2016-12-29 NOTE — ED Notes (Signed)
Patient refused blood draw at this time.

## 2016-12-30 DIAGNOSIS — F4325 Adjustment disorder with mixed disturbance of emotions and conduct: Secondary | ICD-10-CM | POA: Diagnosis present

## 2016-12-30 DIAGNOSIS — F17210 Nicotine dependence, cigarettes, uncomplicated: Secondary | ICD-10-CM

## 2016-12-30 MED ORDER — FLUOXETINE HCL 20 MG PO CAPS: 20.0000 mg | ORAL_CAPSULE | Freq: Every day | ORAL | 0 refills | Status: AC

## 2016-12-30 MED ORDER — TRAZODONE HCL 50 MG PO TABS: 50.0000 mg | ORAL_TABLET | Freq: Every evening | ORAL | 0 refills | Status: AC | PRN

## 2016-12-30 MED ORDER — OLANZAPINE 5 MG PO TABS: 5.0000 mg | ORAL_TABLET | Freq: Every day | ORAL | 0 refills | Status: AC

## 2016-12-30 NOTE — Consult Note (Signed)
[facility] Consult   Reason for Consult:  Agitated after her boyfriend was upset Referring Physician:  EDP Patient Identification: Melody Robinson MRN:  [id number] Principal Diagnosis: Adjustment disorder with mixed disturbance of emotions and conduct Diagnosis:   Patient Active Problem List   Diagnosis Date Noted  . Adjustment disorder with mixed disturbance of emotions and conduct [F43.25] 12/30/2016    Priority: High  . Abscess of abdominal wall [L02.211]   . Borderline personality disorder ([place]) [F60.3] 09/16/2016  . Major depressive disorder, recurrent severe without psychotic features ([place]) [F33.2] 09/15/2016    Total Time spent with patient: 45 minutes  Subjective:   Melody Robinson is a 21 y.o. female patient does not warrant admission.  HPI:  21 yo female who came to the ED via [facility] due to agitation and suicidal after her boyfriend was arrested.  She felt she would not be able to survive the streets without him.  Evidently, he got released and she decided she wanted to leave but due to her behaviors prior to admission, she was kept over night.  She remained calm and cooperative with no suicidal/homicidal ideations, hallucinations, or substance abuse.  Stable for discharge.  Past Psychiatric History: depression  Risk to Self: None Risk to Others: None Prior Inpatient Therapy: Prior Inpatient Therapy: No Prior Outpatient Therapy: Prior Outpatient Therapy: No Does patient have an ACCT team?: No Does patient have Intensive In-House Services?  : No Does patient have [facility] services? : No Does patient have P4CC services?: No  Past Medical History:  Past Medical History:  Diagnosis Date  . Depression   . Hallucinations   . Medical history non-contributory   . PTSD (post-traumatic stress disorder)     Past Surgical History:  Procedure Laterality Date  . NO PAST SURGERIES     Family History:  Family History  Problem Relation Age of Onset  . Cancer  Paternal Grandmother    Family Psychiatric  History: none Social History:  Social History   Substance and Sexual Activity  Alcohol Use No     Social History   Substance and Sexual Activity  Drug Use No    Social History   Socioeconomic History  . Marital status: Legally Separated    Spouse name: None  . Number of children: None  . Years of education: None  . Highest education level: None  Social Needs  . Financial resource strain: None  . Food insecurity - worry: None  . Food insecurity - inability: None  . Transportation needs - medical: None  . Transportation needs - non-medical: None  Occupational History  . None  Tobacco Use  . Smoking status: Current Every Day Smoker    Packs/day: 0.25    Types: Cigarettes  . Smokeless tobacco: Never Used  Substance and Sexual Activity  . Alcohol use: No  . Drug use: No  . Sexual activity: Yes    Birth control/protection: None  Other Topics Concern  . None  Social History Narrative  . None   Additional Social History:    Allergies:  No Known Allergies  Labs:  Results for orders placed or performed during the hospital encounter of 12/29/16 (from the past 48 hour(s))  Rapid urine drug screen (hospital performed)     Status: Abnormal   Collection Time: 12/29/16 12:23 PM  Result Value Ref Range   Opiates NONE DETECTED NONE DETECTED   Cocaine NONE DETECTED NONE DETECTED   Benzodiazepines NONE DETECTED NONE DETECTED   Amphetamines  NONE DETECTED NONE DETECTED   Tetrahydrocannabinol POSITIVE (A) NONE DETECTED   Barbiturates NONE DETECTED NONE DETECTED    Comment: (NOTE) DRUG SCREEN FOR MEDICAL PURPOSES ONLY.  IF CONFIRMATION IS NEEDED FOR ANY PURPOSE, NOTIFY LAB WITHIN 5 DAYS. LOWEST DETECTABLE LIMITS FOR URINE DRUG SCREEN Drug Class                     Cutoff (ng/mL) Amphetamine and metabolites    1000 Barbiturate and metabolites    200 Benzodiazepine                 981 Tricyclics and metabolites     300 Opiates  and metabolites        300 Cocaine and metabolites        300 THC                            50   Comprehensive metabolic panel     Status: Abnormal   Collection Time: 12/29/16 12:52 PM  Result Value Ref Range   Sodium 138 135 - 145 mmol/L   Potassium 3.7 3.5 - 5.1 mmol/L   Chloride 106 101 - 111 mmol/L   CO2 24 22 - 32 mmol/L   Glucose, Bld 103 (H) 65 - 99 mg/dL   BUN 11 6 - 20 mg/dL   Creatinine, Ser 0.55 0.44 - 1.00 mg/dL   Calcium 9.1 8.9 - 10.3 mg/dL   Total Protein 8.0 6.5 - 8.1 g/dL   Albumin 4.1 3.5 - 5.0 g/dL   AST 20 15 - 41 U/L   ALT 10 (L) 14 - 54 U/L   Alkaline Phosphatase 57 38 - 126 U/L   Total Bilirubin 0.4 0.3 - 1.2 mg/dL   GFR calc non Af Amer >60 >60 mL/min   GFR calc Af Amer >60 >60 mL/min    Comment: (NOTE) The eGFR has been calculated using the CKD EPI equation. This calculation has not been validated in all clinical situations. eGFR's persistently <60 mL/min signify possible Chronic Kidney Disease.    Anion gap 8 5 - 15  Ethanol     Status: None   Collection Time: 12/29/16 12:52 PM  Result Value Ref Range   Alcohol, Ethyl (B) <10 <10 mg/dL    Comment:        LOWEST DETECTABLE LIMIT FOR SERUM ALCOHOL IS 10 mg/dL FOR MEDICAL PURPOSES ONLY   Salicylate level     Status: None   Collection Time: 12/29/16 12:52 PM  Result Value Ref Range   Salicylate Lvl <1.9 2.8 - 30.0 mg/dL  Acetaminophen level     Status: Abnormal   Collection Time: 12/29/16 12:52 PM  Result Value Ref Range   Acetaminophen (Tylenol), Serum <10 (L) 10 - 30 ug/mL    Comment:        THERAPEUTIC CONCENTRATIONS VARY SIGNIFICANTLY. A RANGE OF 10-30 ug/mL MAY BE AN EFFECTIVE CONCENTRATION FOR MANY PATIENTS. HOWEVER, SOME ARE BEST TREATED AT CONCENTRATIONS OUTSIDE THIS RANGE. ACETAMINOPHEN CONCENTRATIONS >150 ug/mL AT 4 HOURS AFTER INGESTION AND >50 ug/mL AT 12 HOURS AFTER INGESTION ARE OFTEN ASSOCIATED WITH TOXIC REACTIONS.   cbc     Status: Abnormal   Collection Time:  12/29/16 12:52 PM  Result Value Ref Range   WBC 7.2 4.0 - 10.5 K/uL   RBC 4.38 3.87 - 5.11 MIL/uL   Hemoglobin 11.4 (L) 12.0 - 15.0 g/dL   HCT 35.3 (L) 36.0 - 46.0 %  MCV 80.6 78.0 - 100.0 fL   MCH 26.0 26.0 - 34.0 pg   MCHC 32.3 30.0 - 36.0 g/dL   RDW 15.5 11.5 - 15.5 %   Platelets 272 150 - 400 K/uL  I-Stat beta hCG blood, ED     Status: None   Collection Time: 12/29/16  1:02 PM  Result Value Ref Range   I-stat hCG, quantitative <5.0 <5 mIU/mL   Comment 3            Comment:   GEST. AGE      CONC.  (mIU/mL)   <=1 WEEK        5 - 50     2 WEEKS       50 - 500     3 WEEKS       100 - 10,000     4 WEEKS     1,000 - 30,000        FEMALE AND NON-PREGNANT FEMALE:     LESS THAN 5 mIU/mL     Current Facility-Administered Medications  Medication Dose Route Frequency Provider Last Rate Last Dose  . acetaminophen (TYLENOL) tablet 650 mg  650 mg Oral Q6H PRN Dorie Rank, MD   650 mg at 12/29/16 1829  . diphenhydrAMINE (BENADRYL) injection 50 mg  50 mg Intramuscular Once PRN Patrecia Pour, NP      . FLUoxetine (PROZAC) capsule 20 mg  20 mg Oral Daily Lajean Saver, MD   20 mg at 12/30/16 1002  . LORazepam (ATIVAN) injection 2 mg  2 mg Intramuscular Once PRN Patrecia Pour, NP      . OLANZapine (ZYPREXA) tablet 5 mg  5 mg Oral QHS Lajean Saver, MD   5 mg at 12/29/16 2124  . traZODone (DESYREL) tablet 50 mg  50 mg Oral QHS PRN Lajean Saver, MD   50 mg at 12/29/16 2124  . ziprasidone (GEODON) injection 20 mg  20 mg Intramuscular Once PRN Patrecia Pour, NP       Current Outpatient Medications  Medication Sig Dispense Refill  . clindamycin (CLEOCIN) 150 MG capsule Take 1 capsule (150 mg total) by mouth every 6 (six) hours. (Patient not taking: Reported on 11/14/2016) 28 capsule 0  . FLUoxetine (PROZAC) 20 MG capsule Take 1 capsule (20 mg total) by mouth daily. (Patient not taking: Reported on 12/29/2016) 30 capsule 0  . ibuprofen (ADVIL,MOTRIN) 600 MG tablet Take 1 tablet (600 mg total)  by mouth every 6 (six) hours as needed. (Patient not taking: Reported on 12/29/2016) 30 tablet 0  . OLANZapine (ZYPREXA) 5 MG tablet Take 1 tablet (5 mg total) by mouth at bedtime. (Patient not taking: Reported on 12/29/2016) 30 tablet 0  . traZODone (DESYREL) 50 MG tablet Take 1 tablet (50 mg total) by mouth at bedtime as needed for sleep. (Patient not taking: Reported on 12/29/2016) 30 tablet 0    Musculoskeletal: Strength & Muscle Tone: within normal limits Gait & Station: normal Patient leans: N/A  Psychiatric Specialty Exam: Physical Exam  Constitutional: She is oriented to person, place, and time. She appears well-developed and well-nourished.  HENT:  Head: Normocephalic.  Neck: Normal range of motion.  Respiratory: Effort normal.  Musculoskeletal: Normal range of motion.  Neurological: She is alert and oriented to person, place, and time.  Psychiatric: She has a normal mood and affect. Her speech is normal and behavior is normal. Judgment and thought content normal. Cognition and memory are normal.    Review of Systems  All other systems reviewed and are negative.   Blood pressure (!) 132/94, pulse 61, temperature 98.7 F (37.1 C), temperature source Oral, resp. rate 18, SpO2 99 %.There is no height or weight on file to calculate BMI.  General Appearance: Casual  Eye Contact:  Good  Speech:  Clear and Coherent  Volume:  Normal  Mood:  Euthymic  Affect:  Congruent  Thought Process:  Coherent and Descriptions of Associations: Intact  Orientation:  Full (Time, Place, and Person)  Thought Content:  WDL and Logical  Suicidal Thoughts:  No  Homicidal Thoughts:  No  Memory:  Immediate;   Good Recent;   Good Remote;   Good  Judgement:  Fair  Insight:  Fair  Psychomotor Activity:  Normal  Concentration:  Concentration: Good and Attention Span: Good  Recall:  Good  Fund of Knowledge:  Fair  Language:  Good  Akathisia:  No  Handed:  Right  AIMS (if indicated):      Assets:  Leisure Time Physical Health Resilience Social Support  ADL's:  Intact  Cognition:  WNL  Sleep:        Treatment Plan Summary: Daily contact with patient to assess and evaluate symptoms and progress in treatment, Medication management and Plan adjustment disorder with mixed disturbance of emotions and conduct:  -Crisis stabilization -Medication management:  Started Zyprexa 5 mg at bedtime for mood stabilization, Prozac 20 mg daily for depression, and Trazodone 50 mg at bedtime PRN sleep -Individual counseling  Disposition: No evidence of imminent risk to self or others at present.    Waylan Boga, NP 12/30/2016 11:13 AM  Patient seen face-to-face for psychiatric evaluation, chart reviewed and case discussed with the physician extender and developed treatment plan. Reviewed the information documented and agree with the treatment plan. Corena Pilgrim, MD

## 2016-12-30 NOTE — [facility] Suicide Risk Assessment (Signed)
Suicide Risk Assessment  Discharge Assessment   [facility]   Principal Problem: Adjustment disorder with mixed disturbance of emotions and conduct Discharge Diagnoses:  Patient Active Problem List   Diagnosis Date Noted  . Adjustment disorder with mixed disturbance of emotions and conduct [F43.25] 12/30/2016    Priority: High  . Abscess of abdominal wall [L02.211]   . Borderline personality disorder ([facility]) [F60.3] 09/16/2016  . Major depressive disorder, recurrent severe without psychotic features ([facility]) [F33.2] 09/15/2016    Total Time spent with patient: 45 minutes  Musculoskeletal: Strength & Muscle Tone: within normal limits Gait & Station: normal Patient leans: N/A  Psychiatric Specialty Exam: Physical Exam  Constitutional: She is oriented to person, place, and time. She appears well-developed and well-nourished.  HENT:  Head: Normocephalic.  Neck: Normal range of motion.  Respiratory: Effort normal.  Musculoskeletal: Normal range of motion.  Neurological: She is alert and oriented to person, place, and time.  Psychiatric: She has a normal mood and affect. Her speech is normal and behavior is normal. Judgment and thought content normal. Cognition and memory are normal.    Review of Systems  All other systems reviewed and are negative.   Blood pressure (!) 132/94, pulse 61, temperature 98.7 F (37.1 C), temperature source Oral, resp. rate 18, SpO2 99 %.There is no height or weight on file to calculate BMI.  General Appearance: Casual  Eye Contact:  Good  Speech:  Clear and Coherent  Volume:  Normal  Mood:  Euthymic  Affect:  Congruent  Thought Process:  Coherent and Descriptions of Associations: Intact  Orientation:  Full (Time, Place, and Person)  Thought Content:  WDL and Logical  Suicidal Thoughts:  No  Homicidal Thoughts:  No  Memory:  Immediate;   Good Recent;   Good Remote;   Good  Judgement:  Fair  Insight:  Fair  Psychomotor  Activity:  Normal  Concentration:  Concentration: Good and Attention Span: Good  Recall:  Good  Fund of Knowledge:  Fair  Language:  Good  Akathisia:  No  Handed:  Right  AIMS (if indicated):     Assets:  Leisure Time Physical Health Resilience Social Support  ADL's:  Intact  Cognition:  WNL  Sleep:      Mental Status Per Nursing Assessment::   On Admission:   agitation and suicidal ideations  Demographic Factors:  Adolescent or young adult  Loss Factors: NA  Historical Factors: NA  Risk Reduction Factors:   Sense of responsibility to family, Living with another person, especially a relative, Positive social support and Positive therapeutic relationship  Continued Clinical Symptoms:  None  Cognitive Features That Contribute To Risk:  None    Suicide Risk:  Minimal: No identifiable suicidal ideation.  Patients presenting with no risk factors but with morbid ruminations; may be classified as minimal risk based on the severity of the depressive symptoms    Plan Of Care/Follow-up recommendations:  Activity:  as tolerated Diet:  heart healthy diet  LORD, JAMISON, NP 12/30/2016, 12:09 PM

## 2019-07-06 IMAGING — CR DG CHEST 2V
2 series · 2 of 2 positions shown · non-contrast
Comparison: None.

CLINICAL DATA: Pt c/o frequent center chest pain over the last few
days with some trouble breathing. No chest hx

EXAM:
CHEST  2 VIEW

[w chest pa]
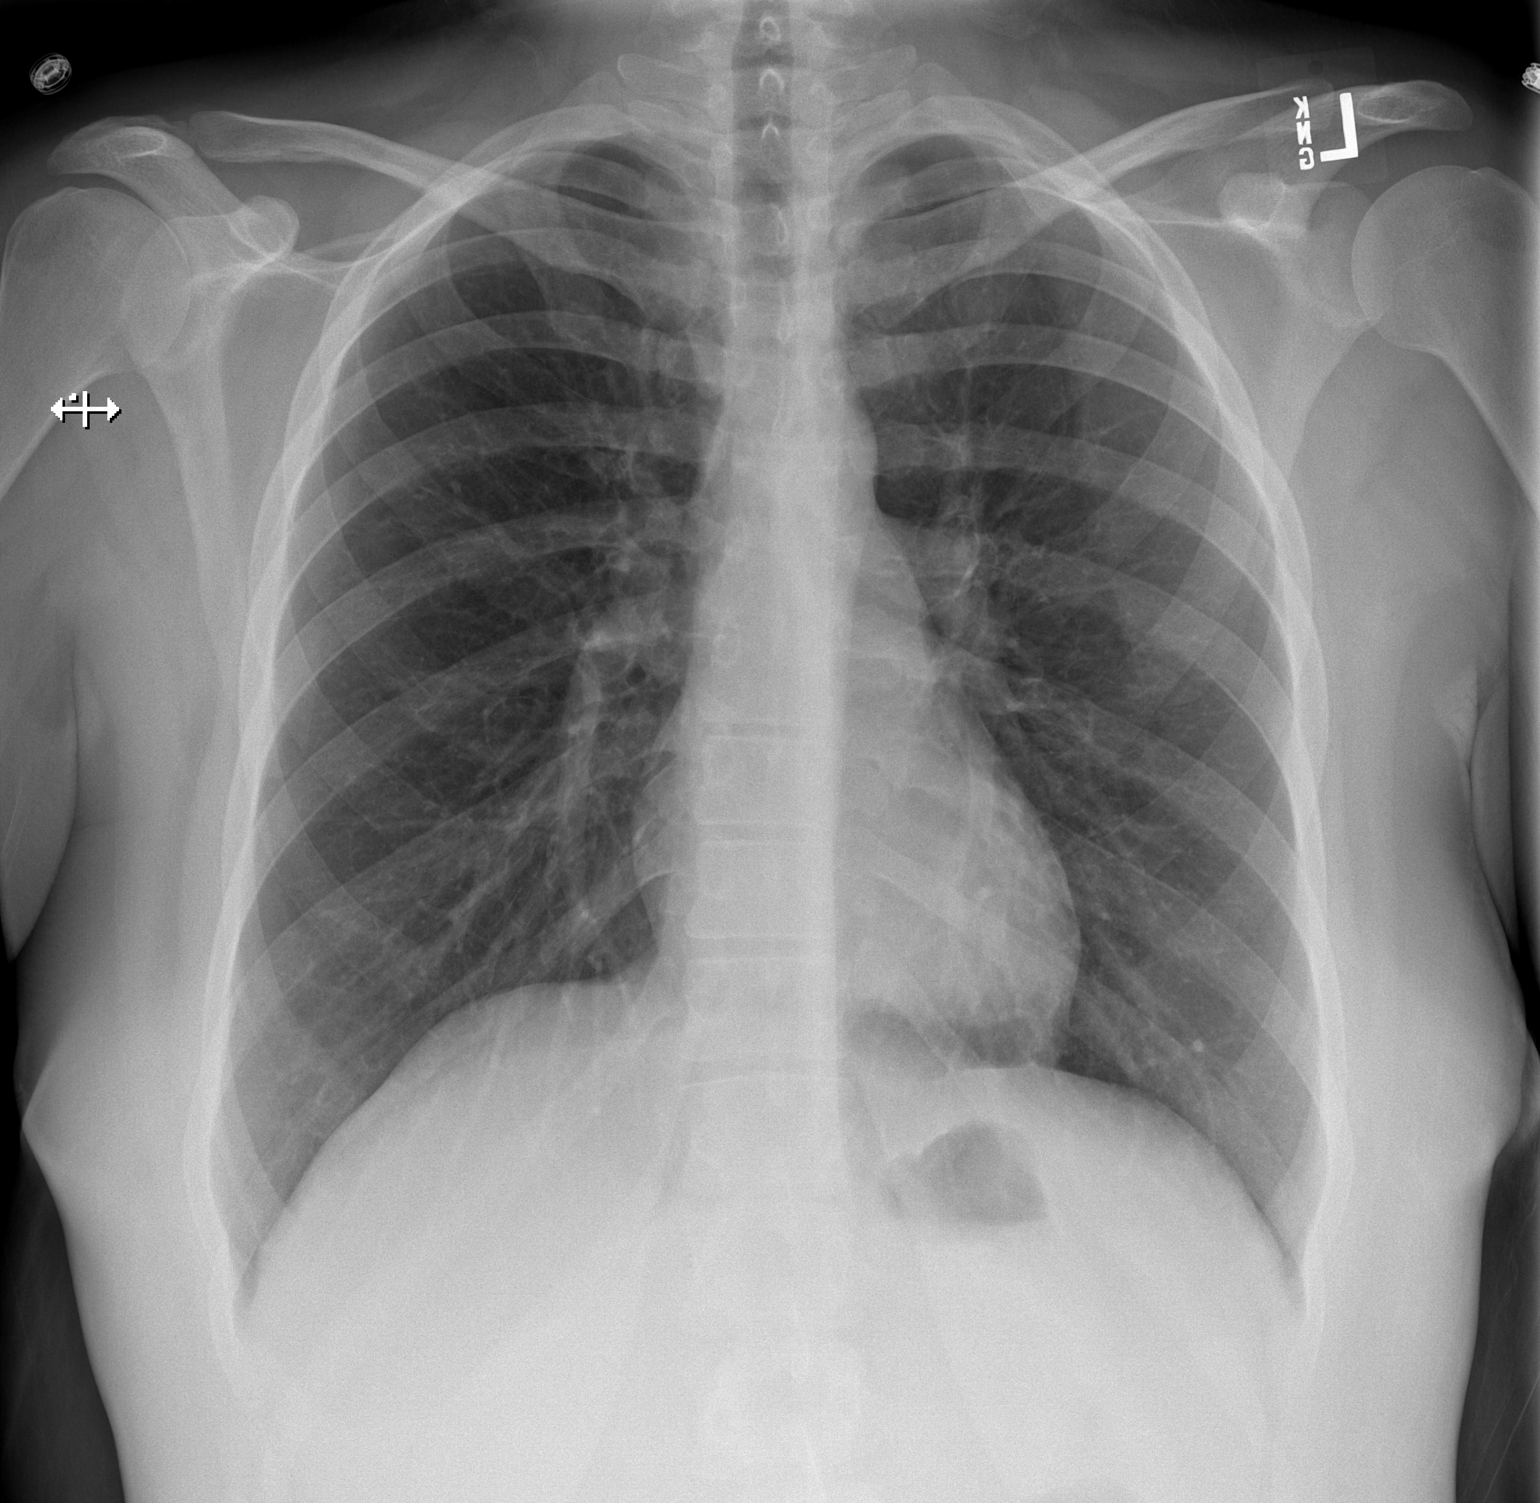

[w chest lat]
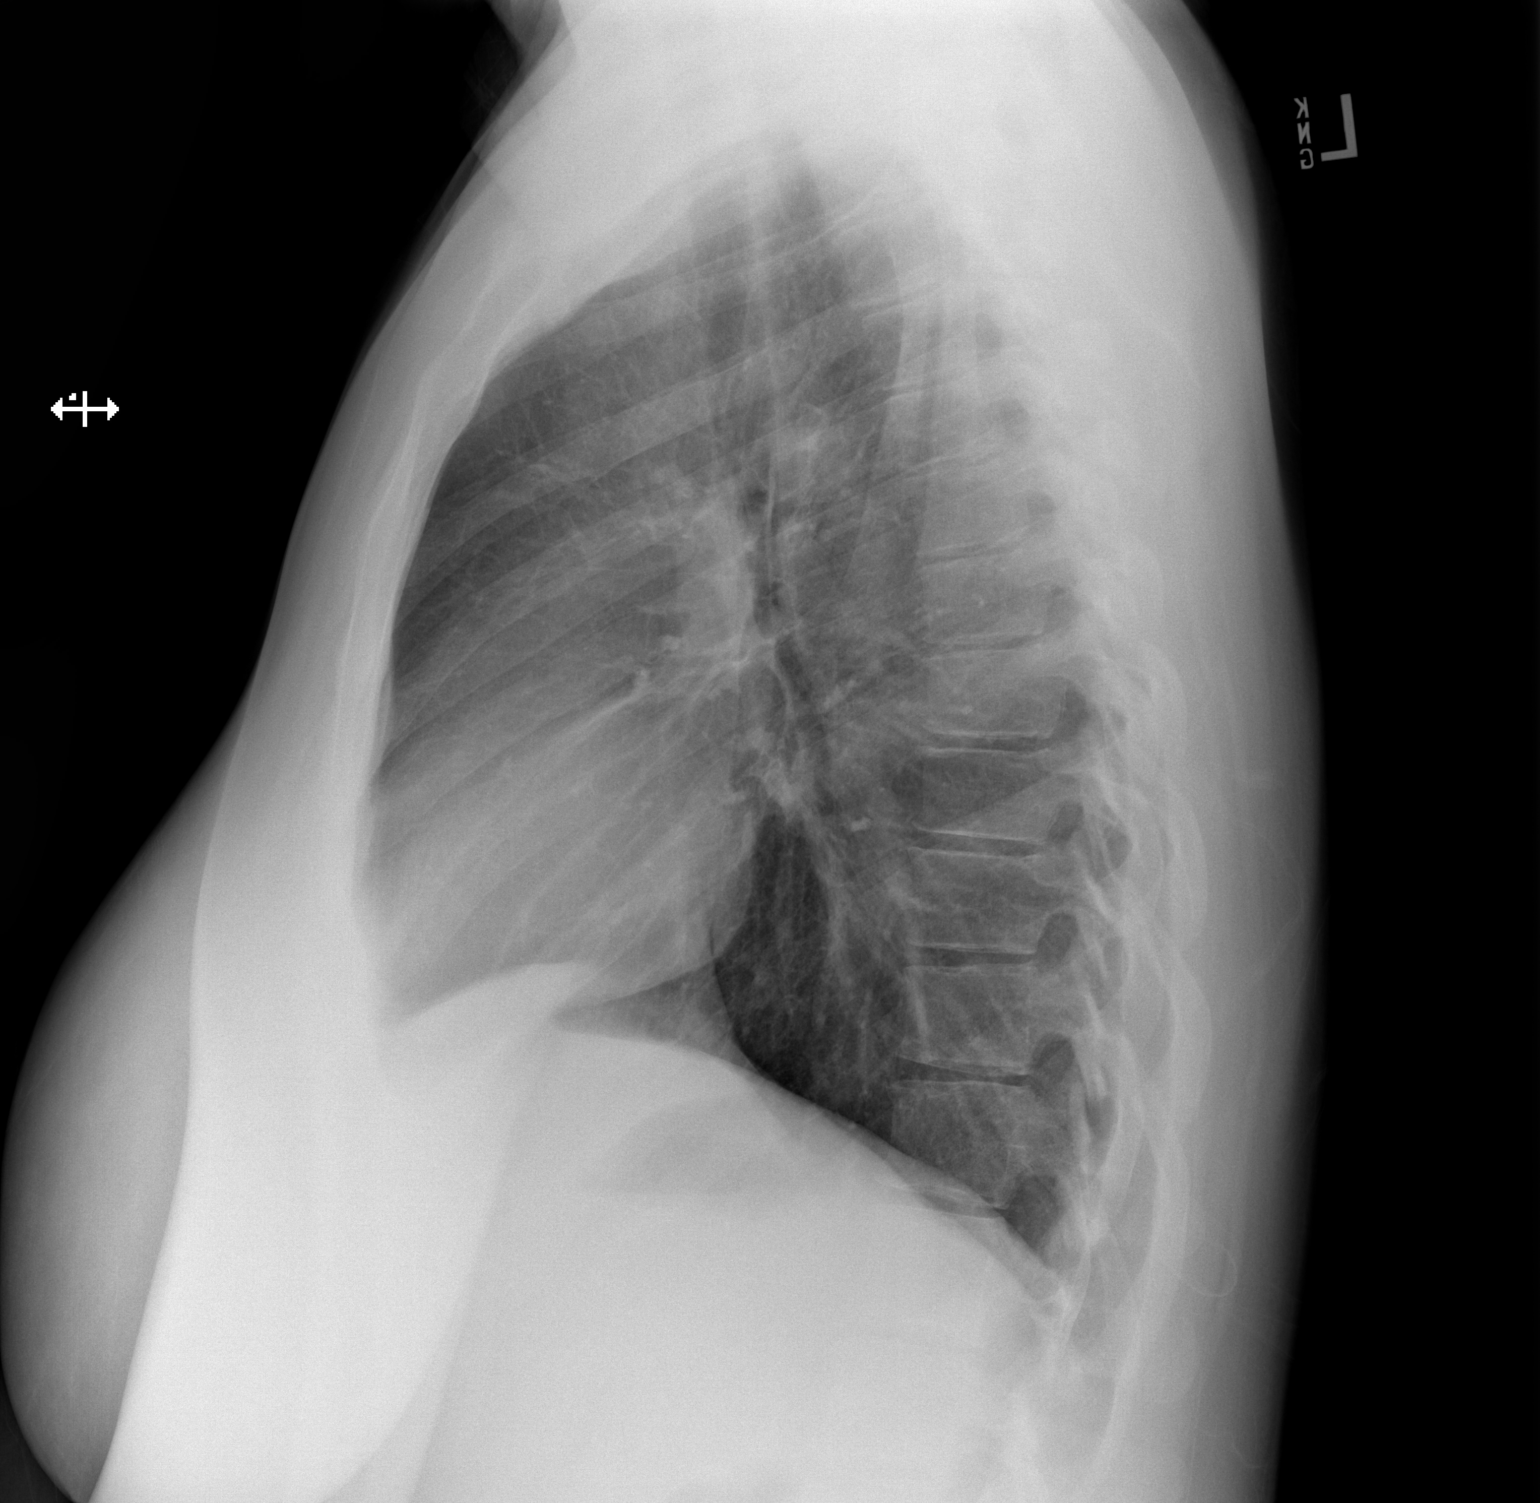

[2 of 2 positions shown; findings below may reference images not displayed]

FINDINGS: Normal heart, mediastinum and hila.

The lungs are clear and are symmetrically aerated.

No pleural effusion or pneumothorax.

Skeletal structures are unremarkable.
IMPRESSION: Normal chest radiographs.
